# Patient Record
Sex: Female | Born: 1965 | Race: White | Hispanic: No | Marital: Married | Smoking: Never smoker
Health system: Southern US, Community
[De-identification: ages and names within clinical notes are randomized; demographics above are authoritative.]

## PROBLEM LIST (undated history)

## (undated) DIAGNOSIS — Z9223 Personal history of estrogen therapy: Secondary | ICD-10-CM

## (undated) DIAGNOSIS — K219 Gastro-esophageal reflux disease without esophagitis: Secondary | ICD-10-CM

## (undated) DIAGNOSIS — E7800 Pure hypercholesterolemia, unspecified: Secondary | ICD-10-CM

## (undated) DIAGNOSIS — J45909 Unspecified asthma, uncomplicated: Secondary | ICD-10-CM

## (undated) HISTORY — PX: ABDOMINAL HYSTERECTOMY: SHX81

## (undated) HISTORY — DX: Personal history of estrogen therapy: Z92.23

## (undated) HISTORY — PX: BILATERAL SALPINGOOPHORECTOMY: SHX1223

---

## 2000-09-28 ENCOUNTER — Ambulatory Visit (HOSPITAL_COMMUNITY): Admission: RE | Admit: 2000-09-28 | Discharge: 2000-09-28 | Payer: Self-pay | Admitting: Obstetrics and Gynecology

## 2000-09-28 ENCOUNTER — Encounter: Payer: Self-pay | Admitting: Obstetrics and Gynecology

## 2000-10-15 ENCOUNTER — Inpatient Hospital Stay (HOSPITAL_COMMUNITY): Admission: RE | Admit: 2000-10-15 | Discharge: 2000-10-17 | Payer: Self-pay | Admitting: Obstetrics and Gynecology

## 2000-11-12 ENCOUNTER — Ambulatory Visit (HOSPITAL_COMMUNITY): Admission: RE | Admit: 2000-11-12 | Discharge: 2000-11-12 | Payer: Self-pay | Admitting: *Deleted

## 2000-11-12 ENCOUNTER — Encounter: Payer: Self-pay | Admitting: Specialist

## 2001-07-22 ENCOUNTER — Encounter: Payer: Self-pay | Admitting: Family Medicine

## 2001-07-22 ENCOUNTER — Ambulatory Visit (HOSPITAL_COMMUNITY): Admission: RE | Admit: 2001-07-22 | Discharge: 2001-07-22 | Payer: Self-pay | Admitting: Family Medicine

## 2002-11-15 ENCOUNTER — Encounter: Payer: Self-pay | Admitting: Family Medicine

## 2002-11-15 ENCOUNTER — Ambulatory Visit (HOSPITAL_COMMUNITY): Admission: RE | Admit: 2002-11-15 | Discharge: 2002-11-15 | Payer: Self-pay | Admitting: Family Medicine

## 2003-06-23 ENCOUNTER — Inpatient Hospital Stay (HOSPITAL_COMMUNITY): Admission: AD | Admit: 2003-06-23 | Discharge: 2003-06-27 | Payer: Self-pay | Admitting: Family Medicine

## 2004-04-08 ENCOUNTER — Ambulatory Visit (HOSPITAL_COMMUNITY): Admission: RE | Admit: 2004-04-08 | Discharge: 2004-04-08 | Payer: Self-pay | Admitting: Obstetrics and Gynecology

## 2005-04-09 ENCOUNTER — Ambulatory Visit (HOSPITAL_COMMUNITY): Admission: RE | Admit: 2005-04-09 | Discharge: 2005-04-09 | Payer: Self-pay | Admitting: Obstetrics and Gynecology

## 2006-04-16 ENCOUNTER — Ambulatory Visit (HOSPITAL_COMMUNITY): Admission: RE | Admit: 2006-04-16 | Discharge: 2006-04-16 | Payer: Self-pay | Admitting: Obstetrics and Gynecology

## 2006-04-22 ENCOUNTER — Ambulatory Visit: Payer: Self-pay | Admitting: Cardiovascular Disease

## 2006-04-28 ENCOUNTER — Encounter: Payer: Self-pay | Admitting: Cardiovascular Disease

## 2006-04-28 ENCOUNTER — Encounter (INDEPENDENT_AMBULATORY_CARE_PROVIDER_SITE_OTHER): Payer: Self-pay | Admitting: Specialist

## 2006-04-28 ENCOUNTER — Encounter: Admission: RE | Admit: 2006-04-28 | Discharge: 2006-04-28 | Payer: Self-pay | Admitting: Obstetrics and Gynecology

## 2006-05-11 ENCOUNTER — Encounter (HOSPITAL_COMMUNITY): Admission: RE | Admit: 2006-05-11 | Discharge: 2006-06-10 | Payer: Self-pay | Admitting: Cardiovascular Disease

## 2006-05-11 ENCOUNTER — Ambulatory Visit: Payer: Self-pay | Admitting: Internal Medicine

## 2008-09-04 ENCOUNTER — Ambulatory Visit (HOSPITAL_COMMUNITY): Admission: RE | Admit: 2008-09-04 | Discharge: 2008-09-04 | Payer: Self-pay | Admitting: Obstetrics & Gynecology

## 2008-10-20 ENCOUNTER — Emergency Department (HOSPITAL_COMMUNITY): Admission: EM | Admit: 2008-10-20 | Discharge: 2008-10-20 | Payer: Self-pay | Admitting: Emergency Medicine

## 2009-10-17 ENCOUNTER — Ambulatory Visit (HOSPITAL_COMMUNITY): Admission: RE | Admit: 2009-10-17 | Discharge: 2009-10-17 | Payer: Self-pay | Admitting: Obstetrics & Gynecology

## 2010-07-07 ENCOUNTER — Encounter: Payer: Self-pay | Admitting: Obstetrics and Gynecology

## 2010-11-01 NOTE — Procedures (Signed)
   NAME:  Jane Baker, Jane Baker                         ACCOUNT NO.:  [id number]   MEDICAL RECORD NO.:  [id number]                   PATIENT TYPE:  OUT   LOCATION:  RESP                                 FACILITY:  [facility]   PHYSICIAN:  Edward L. Juanetta Gosling, M.D.             DATE OF BIRTH:  [birth year]   DATE OF PROCEDURE:  DATE OF DISCHARGE:                              PULMONARY FUNCTION TEST   Spirometry shows no evidence of ventilatory defect.  There is airflow  obstruction at the level of the small airways.  This is relatively mild.                                               Edward L. Juanetta Gosling, M.D.    Gwenlyn Found  D:  11/16/2002  T:  11/16/2002  Job:  [id number]   cc:   Donna Bernard, M.D.  [address]  [place]  [place] [address]  Fax: [phone]

## 2010-11-01 NOTE — Consult Note (Signed)
NAME:  Jane Baker, Jane Baker                         ACCOUNT NO.:  [id number]   MEDICAL RECORD NO.:  [id number]                   PATIENT TYPE:  INP   LOCATION:  A318                                 FACILITY:  [facility]   PHYSICIAN:  Lionel December, M.D.                 DATE OF BIRTH:  06-29-1965   DATE OF CONSULTATION:  06/24/2003  DATE OF DISCHARGE:                                   CONSULTATION   CONSULTING PHYSICIAN:  Lionel December, M.D.   REASON FOR CONSULTATION:  Persistent diarrhea in a patient felt to have  viral gastroenteritis.   HISTORY OF PRESENT ILLNESS:  [name] is a 45 year old Caucasian female who was  admitted to Dr. Lorin Picket A. Luking's service two days ago with copious non-  bloody diarrhea.  Her stool studies are negative, although a culture is  still not completed.  She has not felt any better with supportive therapy.  The patient's present illness began,on June 17, 2003, when she just did  not feel well.  The following day, she had multiple bowel movements.  She  also had a low-grade fever and felt nauseated.  Her diarrhea continued.  She  was seen by Dr. Lilyan Punt on June 20, 2003.  She was treated  symptomatically but her symptoms persisted.  She also began to feel weak and  lightheaded, therefore, was hospitalized.  In the hospital, she has had low-  grade temperature.  Her stool C. diff toxin titer has been negative.  She  had no fecal white cells.  O&P was also negative and cultures are at this  point are not growing any __________ pathogen.  The patient did throw up two  days ago, after she took a dose of Darvocet-N 100.  She has been nauseated.  She did eat a little bit of her sandwich this afternoon.  She already has  had seven stools today.  She feels extremely weak.  She also has been having  a headache.  She has not had rectal bleeding.  She has noted some soreness  on the left side of her abdomen.  She denies joint pain or skin rash.  She  also has not had  any sore throat.  About three week ago, she had a splinter  to one of her fingers and had cellulitis and was treated with amoxicillin  but did not have any trouble at that time.  She has not recently traveled  out of the country and did drink well water, but no other member of the  family has been sick.  The patient has lost about 5 pounds with this  illness.   MEDICATIONS:  She is on:  1. Estratest tab one daily.  2. She was on Flagyl IV which was discontinued this morning.  3. She is on dicyclomine 10 mg t.i.d. p.r.n.  4. Phenergan 25 mg IV q.4h. p.r.n.  5. Ambien 10 mg q.h.s.  p.r.n.  6. Darvocet-N 100 one q.4h. p.r.n.   PAST MEDICAL HISTORY:  1. She does not have any medical problems.  2. She had a hysterectomy three years ago.  3. Two years ago, she had bilateral oophorectomy with an incidental     appendectomy.  4. She was diagnosed with endometriosis.   ALLERGIES:  Possibly to SULFA, according to the patient's mother.   FAMILY HISTORY:  Noncontributory.  She has three sisters in good health.   SOCIAL HISTORY:  She is married.  She works part-time.  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  She does not smoke cigarettes or drink alcohol.   PHYSICAL EXAMINATION:  GENERAL:  A well-developed, well-nourished, Caucasian  female who appears to be acutely ill.  She prefers to keep her eyes closed;  however, she does not appear to be in any pain.  VITAL SIGNS:  Admission weight recorded to be 140.4 pounds.  She is 5 feet 3  inches tall.  Pulse 89 per minute, blood pressure 114/63, respiratory rate  is 18 and temp 99.5.  Her temp was 100.8 on the evening of June 22, 2003.  HEENT:  Conjunctivae is pink.  Sclera is nonicteric.  Oropharyngeal mucosa  is normal and moist.  NECK:  Supple.  No thyromegaly or lymphadenopathy.  CARDIAC:  With a regular rhythm,  normal S1 and S2.  No murmur or gallop  noted.  LUNGS:  Clear to auscultation.  ABDOMEN:  Symmetrical bowel sounds are  hyperactive.  Palpation reveals a  soft abdomen, mild tenderness at LLQ  and epigastric area.  No organomegaly  or masses.  RECTAL:  Deferred.  EXTREMITIES:  She does not have peripheral edema, skin rash or clubbing.   LABS:  On admission:  WBC 7.3, H&H 14.3 and 41.3, platelet count 228K.  She  had 79 segs, 9 lymhs, 11 monos and 1 eo.  Repeat CBC yesterday:  WBC was  5.6, she had 14% monos and 2% eosinophils.  Electrolytes, on admission,  sodium 134, potassium 3.5, chloride 102, CO2 25, glucose 86, and BUN 8,  creatinine 0.9.  LFTs normal except a protein of 5.8 and albumin 2.8.  Her  calcium is 7.8.  Amylase was 58, lipase 28.   Stool cultures, etcetera, as above.   ASSESSMENT:  [name] is a 45 year old Caucasian female with an eight day  history of the present illness.  She has copious non-bloody diarrhea.  Her  complete blood count is unremarkable except for a mild monocytosis.  Her  stool studies are negative, even though final on culture is still pending.  She has not improved much with supportive therapy and intravenous fluids.   I agree with Dr. Lilyan Punt that we are most likely dealing with  protracted viral illness.  She certainly could have Campylobacter infection  or toxic Escherichia coli infection.  I doubt that we are dealing with  inflammatory bowel disease presenting as an acute illness.  If the patient  does not improve within the next 48 hours, we will consider flexible  sigmoidoscopy.   RECOMMENDATIONS:  The IV fluids will be increased to 150 cc/hr for the next  24 hours.  We will give her MVI in IV fluids today and in the a.m.  Stool  studies will be repeated including culture for E. coli.  I have taken the  liberty of stopping her Darvocet and have requested morphine sulfate to be  used only on a p.r.n. basis.  I would also  encourage her to eat yogurt with each meal, and I have left her diet as it is except requested a low residue  diet.  CBC with differential  and metabolic-7 along with serum magnesium will  be repeated in the a.m.   I would like to thank Dr. Lilyan Punt for the opportunity to participate in  the care of this nice lady.      ___________________________________________                                            Lionel December, M.D.   NR/MEDQ  D:  06/24/2003  T:  06/24/2003  Job:  [id number]

## 2010-11-01 NOTE — H&P (Signed)
NAME:  Jane Baker, Jane Baker                         ACCOUNT NO.:  [id number]   MEDICAL RECORD NO.:  [id number]                   PATIENT TYPE:  OBV   LOCATION:  A318                                 FACILITY:  [facility]   PHYSICIAN:  Scott A. Gerda Diss, M.D.               DATE OF BIRTH:  [birth year]   DATE OF ADMISSION:  06/22/2003  DATE OF DISCHARGE:                                HISTORY & PHYSICAL   CHIEF COMPLAINT:  Nausea, persistent diarrhea and near syncope.   HISTORY OF PRESENT ILLNESS:  This 45 year old, white female presents with  significant weakness that has been brought on by persistent diarrhea over  the course of the past five days.  It started about six or seven days ago  with nausea and fatigue and then became persistent watery diarrhea.  There  was no mucus or blood.  The patient had gotten to the point where she would  not eat or drink anything because she felt it was stimulating diarrhea.  In  addition to this, she also relates moderate decrease in her urination and  feels very weak when she stands up.  She has come close to passing out and  husband has literally had to help her around.   PAST MEDICAL HISTORY:  Hysterectomy about three years ago.  No other  hospitalizations.   ALLERGIES:  No known drug allergies.   MEDICATIONS:  Estratest.   SOCIAL HISTORY:  She lives with husband.  She has not been on any camping  trips or unusual use of water.  She was treated in December with Augmentin  for a cellulitis.  She has not been on any other antibiotics.   FAMILY HISTORY:  Noncontributory.   REVIEW OF SYMPTOMS:  Negative for headaches, rash or joint pains.   PHYSICAL EXAMINATION:  GENERAL:  NAD.  Looks to feel ill.  HEENT:  Mucous membranes tacky.  NECK:  Supple.  CHEST:  CTA.  HEART:  Regular.  ABDOMEN:  Soft.  EXTREMITIES:  No edema.  VITAL SIGNS:  Blood pressure 100/60 initially and then when orthostatics  were done it was 110/70 lying, 100/60 sitting and 94/56  standing.  This was  at the office.   LABORATORY DATA AND X-RAY FINDINGS:  White count 7.3, hemoglobin 14.3, left  shift with 79 segs.  Potassium 3.5, sodium 134, BUN 8, creatinine 0.9.  Bilirubin 0.7.  Liver enzymes normal.  Albumin low at 2.8.  Amylase 58,  lipase 28.  Stool wbc's negative.   ASSESSMENT/PLAN:  Gastroenteritis with persistent diarrhea and weakness,  mild dehydration based on orthostasis.  Feel the patient would benefit from  intravenous fluids.  Will do some tests and look for Clostridium difficile  toxin.  It is hard to discern if the patient will need to stay beyond this  time frame at this moment.  Treat with Flagyl IV presumptively to cover for  Clostridium difficile.  ___________________________________________                                         Jonna Coup Gerda Diss, M.D.   Linus Orn  D:  06/23/2003  T:  06/23/2003  Job:  [id number]

## 2010-11-01 NOTE — Procedures (Signed)
[name], Jane Baker NO.:  [id number]   MEDICAL RECORD NO.:  [id number]          PATIENT TYPE:  OUT   LOCATION:  RAD                           FACILITY:  [facility]   PHYSICIAN:  Pricilla Riffle, MD, FACCDATE OF BIRTH:  1965-09-24   DATE OF PROCEDURE:  05/11/2006  DATE OF DISCHARGE:                                ECHOCARDIOGRAM   REFERRING PHYSICIAN:  Dr. Gerda Diss.   TEST INDICATIONS:  A 45 year old with a history of chest pain.  No prior  cardiac history.   A 2D echo with echo Doppler.  Left ventricle is normal in size with an  end-diastolic dimension of 44 mm, interventricular septum and posterior  wall are normal at 10 and 9 mm each.  The left atrium is normal.  Right  atrium and right ventricle are normal.   The aortic valve is grossly normal.  There is no insufficiency.  The  mitral valve is mildly thickened with trace insufficiency.  Pulmonic  valve is normal.  Tricuspid valve is normal with no insufficiency.   Overall, LVEF is normal with an LV at approximately 60%.  RVEF is  normal.  No pericardial effusion is seen.  IVC normal.      Pricilla Riffle, MD, [facility]  Electronically Signed     PVR/MEDQ  D:  05/11/2006  T:  05/11/2006  Job:  [id number]   cc:   Dr Gerda Diss

## 2010-11-01 NOTE — Assessment & Plan Note (Signed)
[facility]                         [place] CARDIOLOGY OFFICE NOTE   DREAMA, KUNA                      MRN:          [id number]  DATE:04/22/2006                            DOB:          28-Apr-1966    HISTORY OF PRESENT ILLNESS:  Mrs. Wille is a 45 year old patient referred  by Dr. Emelda Fear for increasing chest pain and shortness of breath.  She has  been having the problems for over the last 4-6 weeks.  Her pain is somewhat  atypical.  It can awaken her up at night.  However, she does get it with  exertion.  There is radiation to the left arm.  She has noted increasing  exertional dyspnea.  There is no pleurisy component.   There has been no sputum production or fever.  She is a nonsmoker.   Her coronary risk factors include positive family history and significant  hypercholesterolemia with an LDL cholesterol of about 170.  She was started  on Zocor.   She apparently just had a mammogram this week and was told that it was  abnormal, and she is to see one of the radiologists at [facility] tomorrow.   In regards to her other symptoms, she is not experiencing any palpitations,  lower extremity edema or syncope.   She had not previously had chest pain prior to 4-6 weeks ago.   She was referred by Dr. Emelda Fear and was concerned about her pain and  shortness of breath.   She has not had any functional tests or PFT's.   REVIEW OF SYSTEMS:  Remarkable for increasing fatigue, shortness of breath  and chest pain.   SOCIAL HISTORY:  She is happily married.  Her husband travels a lot during  the day as a Medical illustrator.  She has two older children who are doing well.  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   MEDICATIONS:  1. Estrogen for previous hysterectomy.  Because of her estrogen therapy,      she has had yearly mammograms.  2. Zocor 20 mg daily.   Symptoms predated this.   PHYSICAL EXAMINATION:  GENERAL:  She is a little bit  anxious.  VITAL SIGNS:  Blood pressure 120/70, pulse 70 and regular.  LUNGS:  Clear.  SKIN:  Warm and dry.  HEENT:  Normal.  No thyromegaly.  No carotid bruits.  No lymphadenopathy.  HEART:  There is an S1 and S2 with normal heart sounds.  ABDOMEN:  Benign.  LOWER EXTREMITIES:  Intact pulses.  No edema.   STUDIES:  EKG showed sinus rhythm.  No significant ST-T wave changes and  complete right bundle branch block.   IMPRESSION:  The patient's chest pain is somewhat debilitating and  increasing over the last 4-6 weeks.  It has atypical features.  However, she  is essentially post menopausal without a uterus and has significant  hyperlipidemia.   There are multiple ways we can proceed here.  We could do a stress Myoview  and a 2D echocardiogram in regards to her chest pain and dyspnea.   However, given her relatively low  heart rate, I think I would prefer to do a  cardiac CT on the patient.   This would allow [place] to rule out significant coronary artery disease and also  to assess her lung fields in the face of dyspnea.   We will try to get this approved with [facility].  The cardiac  CT would be to replace a stress test and an echocardiogram.   I honestly do not think if her cardiac CT was normal that she would need  further cardiac workup.  She will be going to [facility] in regards to her  abnormal mammogram.   The CT scan would also help [place] to visualize any possible other abnormalities  in regards to her abnormal mammogram.   Further recommendations will be based on her cardiac CT hopefully.  Again,  if [employer] will not cover this, we will have to order a stress Myoview  and an echocardiogram.  She will need nuclear imaging since she has  nonspecific ST-T wave changes and having complete right bundle on her ECG.    ______________________________  Noralyn Pick. Eden Emms, MD, [facility]    PCN/MedQ  DD: 04/22/2006  DT: 04/23/2006  Job #: [id number]

## 2010-11-01 NOTE — Discharge Summary (Signed)
NAME:  Jane Baker, Jane Baker                         ACCOUNT NO.:  [id number]   MEDICAL RECORD NO.:  [id number]                   PATIENT TYPE:  INP   LOCATION:  A318                                 FACILITY:  [facility]   PHYSICIAN:  Donna Bernard, M.D.             DATE OF BIRTH:  1966/02/03   DATE OF ADMISSION:  06/22/2003  DATE OF DISCHARGE:  06/27/2003                                 DISCHARGE SUMMARY   FINAL DIAGNOSES:  1. Gastroenteritis.  2. Severe abdominal pain secondary to #1.  3. Volume contraction.  4. Insomnia.   FINAL DISPOSITION:  Patient discharged to home.   DISCHARGE MEDICATIONS:  1. Imodium capsules up to t.i.d. p.r.n. for diarrhea.  2. Ambien 10 mg q.h.s. p.r.n. for sleep.   DISCHARGE INSTRUCTIONS:  1. Report any significant recurrence of severe abdominal pain.  2. Cultured yogurt t.i.d.   ADMISSION HISTORY AND PHYSICAL:  Please H&P as dictated.   HOSPITAL COURSE:  This patient is a 45 year old white female who was  admitted to the hospital with nausea, significant abdominal pain and  persistent diarrhea and near syncope.  The patient related feeling very weak  when she stood up and felt as if she was due to pass out.  The patient had  orthostatic changes at 110 or 70 lying down and blood pressure 90/56 while  standing up.  Blood work was done.  White blood count was normal, albumin  was borderline low.  Stool tests were done checking for Clostridium and  other pathological bacteria.   The patient was given IV fluids.  We elected to cover her with antibiotics  pending cultures.  48 hours after admission, the patient continued to have  copious nonbloody diarrhea.  Due to her ongoing severe distress, GI folks  were consulted.  They made some recommendations as far as adding Flagyl and  adjusting her fluids.  The patient had a slower than usual response to  therapy.  Each day, she had severe ongoing pain and a very anxious and  attentive family attending her.  In  addition, the diarrhea was disabling.  The GI folks thought that this was probably a viral infectious  enterocolitis.  However, they did recommend maintaining the same  medications.   On the day of discharge, the patient was finally feeling better.  Her  abdominal pain was improved.  Her stool tests remained negative.  The  patient was discharged home with diagnosis and disposition as noted above.    ___________________________________________                                         Donna Bernard, M.D.   WSL/MEDQ  D:  07/16/2003  T:  07/16/2003  Job:  [id number]

## 2012-09-23 ENCOUNTER — Telehealth: Payer: Self-pay | Admitting: Family Medicine

## 2012-09-23 NOTE — Telephone Encounter (Signed)
Yes please do

## 2012-09-23 NOTE — Telephone Encounter (Signed)
Med called into [facility]. Patient notified.

## 2012-09-23 NOTE — Telephone Encounter (Signed)
Patient says that her epi pen has expired and she is highly allergic to wasps. Can we call her another one in to [facility] in [place]?

## 2012-10-04 ENCOUNTER — Other Ambulatory Visit: Payer: Self-pay | Admitting: Adult Health

## 2012-10-04 DIAGNOSIS — Z139 Encounter for screening, unspecified: Secondary | ICD-10-CM

## 2012-10-07 ENCOUNTER — Ambulatory Visit (HOSPITAL_COMMUNITY)
Admission: RE | Admit: 2012-10-07 | Discharge: 2012-10-07 | Disposition: A | Discharge status: Home / Self Care | Payer: BC Managed Care – PPO | Source: Ambulatory Visit | Attending: Adult Health | Admitting: Adult Health

## 2012-10-07 DIAGNOSIS — Z1231 Encounter for screening mammogram for malignant neoplasm of breast: Secondary | ICD-10-CM | POA: Insufficient documentation

## 2012-10-07 DIAGNOSIS — Z139 Encounter for screening, unspecified: Secondary | ICD-10-CM

## 2012-10-08 ENCOUNTER — Telehealth: Payer: Self-pay | Admitting: Adult Health

## 2012-10-08 NOTE — Telephone Encounter (Signed)
Pt. Called wanted mammogram results from yesterday, was normal, get one next year

## 2012-12-02 ENCOUNTER — Telehealth: Payer: Self-pay | Admitting: Adult Health

## 2012-12-02 MED ORDER — ESTRADIOL 0.52 MG/0.87 GM (0.06%) TD GEL: 1.0000 "application " | Freq: Two times a day (BID) | TRANSDERMAL | Status: DC

## 2012-12-02 NOTE — Telephone Encounter (Signed)
Pt requesting Elestrin 0.06% samples. Pt states going out of town and has an appt already scheduled with Cyril Mourning, NP in a couple of weeks. Samples left at front desk for pt to pick up.

## 2012-12-30 ENCOUNTER — Encounter: Payer: Self-pay | Admitting: Adult Health

## 2012-12-30 ENCOUNTER — Other Ambulatory Visit: Payer: BC Managed Care – PPO

## 2012-12-30 ENCOUNTER — Ambulatory Visit (INDEPENDENT_AMBULATORY_CARE_PROVIDER_SITE_OTHER): Payer: BC Managed Care – PPO | Admitting: Adult Health

## 2012-12-30 VITALS — BP 120/80 | HR 70 | Ht 62.5 in | Wt 161.0 lb

## 2012-12-30 DIAGNOSIS — Z79899 Other long term (current) drug therapy: Secondary | ICD-10-CM

## 2012-12-30 DIAGNOSIS — Z79818 Long term (current) use of other agents affecting estrogen receptors and estrogen levels: Secondary | ICD-10-CM

## 2012-12-30 DIAGNOSIS — Z01419 Encounter for gynecological examination (general) (routine) without abnormal findings: Secondary | ICD-10-CM

## 2012-12-30 DIAGNOSIS — Z1212 Encounter for screening for malignant neoplasm of rectum: Secondary | ICD-10-CM

## 2012-12-30 DIAGNOSIS — Z9223 Personal history of estrogen therapy: Secondary | ICD-10-CM

## 2012-12-30 HISTORY — DX: Personal history of estrogen therapy: Z92.23

## 2012-12-30 LAB — COMPREHENSIVE METABOLIC PANEL
Albumin: 4.5 g/dL (ref 3.5–5.2)
BUN: 12 mg/dL (ref 6–23)
CO2: 27 mEq/L (ref 19–32)
Calcium: 10.3 mg/dL (ref 8.4–10.5)
Chloride: 102 mEq/L (ref 96–112)
Glucose, Bld: 89 mg/dL (ref 70–99)
Potassium: 5 mEq/L (ref 3.5–5.3)

## 2012-12-30 LAB — TSH: TSH: 1.755 u[IU]/mL (ref 0.350–4.500)

## 2012-12-30 LAB — HEMOCCULT GUIAC POC 1CARD (OFFICE)

## 2012-12-30 LAB — LIPID PANEL
Cholesterol: 289 mg/dL — ABNORMAL HIGH (ref 0–200)
LDL Cholesterol: 213 mg/dL — ABNORMAL HIGH (ref 0–99)
Triglycerides: 123 mg/dL (ref <150)

## 2012-12-30 LAB — CBC
HCT: 40.9 % (ref 36.0–46.0)
Hemoglobin: 14.1 g/dL (ref 12.0–15.0)
WBC: 7.3 10*3/uL (ref 4.0–10.5)

## 2012-12-30 MED ORDER — ESTRADIOL 0.52 MG/0.87 GM (0.06%) TD GEL: 1.0000 "application " | Freq: Two times a day (BID) | TRANSDERMAL | Status: DC

## 2012-12-30 NOTE — Patient Instructions (Addendum)
Physical in 1 year Mammogram yearly Colonoscopy at 48  

## 2012-12-30 NOTE — Progress Notes (Signed)
Patient ID: [name], female   DOB: 04/26/1966, 47 y.o.   MRN: [id number] History of Present Illness: Jane Baker is a 47 year old white female, married in for a physical. She likes Elestrin and can tell if she misses it.  Current Medications, Allergies, Past Medical History, Past Surgical History, Family History and Social History were reviewed in [employer] record.     Review of Systems: Patient denies any daily headaches, blurred vision, shortness of breath, chest pain, abdominal pain, problems with bowel movements, urination, or intercourse. But she had a headache last week in the back of her head and it lasted almost 2 days.No joint pain or mood changes, she does have some sleep disturbance.And increased stress lately as her son just got married.    Physical Exam:BP 120/80  Pulse 70  Ht 5' 2.5" (1.588 m)  Wt 161 lb (73.029 kg)  BMI 28.96 kg/m2 General:  Well developed, well nourished, no acute distress Skin:  Warm and dry,tan Neck:  Midline trachea, normal thyroid Lungs; Clear to auscultation bilaterally Breast:  No dominant palpable mass, retraction, or nipple discharge Cardiovascular: Regular rate and rhythm Abdomen:  Soft, non tender, no hepatosplenomegaly Pelvic:  External genitalia is normal in appearance.  The vagina is normal in appearance,the cervix and uterus are absent.  No adnexal masses or tenderness noted. Rectal: Good sphincter tone, no polyps felt, she does have a hemorrhoid. Hemoccult negative. Extremities:  No swelling or varicosities noted Psych:  Alert and cooperative, seems happy   Impression: Yearly gyn exam-no pap Estrogen theapy    Plan: Check CB,CMP,TSH and lipids Physical yearly Mammogram yearly Colonoscopy at 48 Continue Elestrin Number of samples 6 Lot number [facility]    Exp date 10/14

## 2013-01-04 ENCOUNTER — Telehealth: Payer: Self-pay | Admitting: Adult Health

## 2013-01-04 MED ORDER — ROSUVASTATIN CALCIUM 5 MG PO TABS: 5.0000 mg | ORAL_TABLET | Freq: Every day | ORAL | Status: DC

## 2013-01-04 NOTE — Telephone Encounter (Signed)
Left message to call me about labs  

## 2013-01-04 NOTE — Telephone Encounter (Signed)
Pt aware of labs and need for statin and recheck labs in 3 months

## 2013-01-05 ENCOUNTER — Other Ambulatory Visit: Payer: Self-pay | Admitting: Adult Health

## 2013-01-05 MED ORDER — SIMVASTATIN 20 MG PO TABS: 20.0000 mg | ORAL_TABLET | Freq: Every evening | ORAL | Status: DC

## 2013-01-31 ENCOUNTER — Telehealth: Payer: Self-pay | Admitting: Family Medicine

## 2013-01-31 ENCOUNTER — Encounter: Payer: Self-pay | Admitting: Family Medicine

## 2013-01-31 ENCOUNTER — Ambulatory Visit (INDEPENDENT_AMBULATORY_CARE_PROVIDER_SITE_OTHER): Payer: BC Managed Care – PPO | Admitting: Family Medicine

## 2013-01-31 VITALS — BP 112/78 | Temp 98.3°F | Ht 62.5 in | Wt 160.4 lb

## 2013-01-31 DIAGNOSIS — E785 Hyperlipidemia, unspecified: Secondary | ICD-10-CM | POA: Insufficient documentation

## 2013-01-31 DIAGNOSIS — J329 Chronic sinusitis, unspecified: Secondary | ICD-10-CM

## 2013-01-31 DIAGNOSIS — J45909 Unspecified asthma, uncomplicated: Secondary | ICD-10-CM

## 2013-01-31 DIAGNOSIS — J683 Other acute and subacute respiratory conditions due to chemicals, gases, fumes and vapors: Secondary | ICD-10-CM | POA: Insufficient documentation

## 2013-01-31 MED ORDER — AZITHROMYCIN 250 MG PO TABS: ORAL_TABLET | ORAL | Status: DC

## 2013-01-31 MED ORDER — ALBUTEROL SULFATE HFA 108 (90 BASE) MCG/ACT IN AERS: 2.0000 | INHALATION_SPRAY | Freq: Four times a day (QID) | RESPIRATORY_TRACT | Status: DC | PRN

## 2013-01-31 NOTE — Telephone Encounter (Signed)
Left message to return call 

## 2013-01-31 NOTE — Progress Notes (Signed)
  Subjective:    Patient ID: Jane Baker, female    DOB: 11-May-1966, 47 y.o.   MRN: [id number]  Sore Throat  This is a new problem. The current episode started in the past 7 days. The problem has been gradually worsening. Neither side of throat is experiencing more pain than the other. There has been no fever. The pain is at a severity of 5/10. The pain is moderate. Associated symptoms include congestion and ear pain. Pertinent negatives include no abdominal pain or headaches. She has had no exposure to strep. She has tried nothing (mucinex d and tylenol) for the symptoms.    Some ear discomfort intermit  Review of Systems  HENT: Positive for ear pain and congestion.   Gastrointestinal: Negative for abdominal pain.  Neurological: Negative for headaches.   Also notes wheezing intermittently, would like refill on inhaler. Uses rarely usually during sickness    Objective:   Physical Exam  Alert no acute distress. HEENT moderate nasal congestion. Maxillary tenderness pharynx slight erythema neck supple. Lungs clear heart regular in rhythm      Assessment & Plan:  Impression 1 acute rhinosinusitis #2 reactive airways plan Z-Pak. Ventolin when necessary. Symptomatic care discussed. WSL

## 2013-01-31 NOTE — Telephone Encounter (Signed)
Patient will need office visit for antibiotic.

## 2013-01-31 NOTE — Telephone Encounter (Signed)
Advised that patient needed office visit before we can prescribe antibiotics. Office visit scheduled.

## 2013-01-31 NOTE — Telephone Encounter (Signed)
Patient has a cough, sore throat, no fever, draingage in throat, congestion and spouse says that she absolutely does not want to come in because she is so sick and is wanting to know if we can call something in.

## 2013-02-21 ENCOUNTER — Telehealth: Payer: Self-pay | Admitting: Family Medicine

## 2013-02-21 NOTE — Telephone Encounter (Signed)
Last seen three wks ago for cough and wheezing, rec f u ov

## 2013-02-21 NOTE — Telephone Encounter (Signed)
Pt still having right side pain, especially when she coughs or sneezes. Wants to know if she needs more meds or xray? Please advise, [phone]

## 2013-02-21 NOTE — Telephone Encounter (Signed)
Patient was transferred to front desk to schedule appointment.  

## 2013-02-23 ENCOUNTER — Ambulatory Visit (INDEPENDENT_AMBULATORY_CARE_PROVIDER_SITE_OTHER): Payer: BC Managed Care – PPO | Admitting: Family Medicine

## 2013-02-23 ENCOUNTER — Encounter: Payer: Self-pay | Admitting: Family Medicine

## 2013-02-23 VITALS — BP 138/92 | Temp 98.4°F | Ht 62.5 in | Wt 163.6 lb

## 2013-02-23 DIAGNOSIS — R079 Chest pain, unspecified: Secondary | ICD-10-CM

## 2013-02-23 MED ORDER — ETODOLAC ER 400 MG PO TB24: 400.0000 mg | ORAL_TABLET | Freq: Every day | ORAL | Status: DC

## 2013-02-23 NOTE — Progress Notes (Signed)
  Subjective:    Patient ID: Jane Baker, female    DOB: 1966/03/14, 47 y.o.   MRN: [id number]  Abdominal Pain This is a new problem. The current episode started 1 to 4 weeks ago. The problem occurs intermittently. The problem has been gradually worsening. The pain is located in the RUQ and right flank. The quality of the pain is sharp. The abdominal pain radiates to the chest, back, RUQ and right flank. The pain is aggravated by coughing and palpation. She has tried acetaminophen (Heating pad) for the symptoms. The treatment provided mild relief.    Cong much better bad cough, sneeze hard. Motion causes worsening. Pain with movement   ruq pos tenderness,      Review of Systems  Gastrointestinal: Positive for abdominal pain.   Pain has been sharp very severe right-sided. Worse with motion. Worse with direct pressure on that region. Worse with a cough. Patient worried about pneumonia or pleurisy.    Objective:   Physical Exam  Alert no acute distress. Lungs clear. Heart regular rate and rhythm. Right costal margin exquisitely tender to palpation. Abdomen benign.      Assessment & Plan:  Impression post cough costochondritis discussed at length. Plan hold off on x-ray will not be helpful discussed. Lodine twice a day with food when necessary to affected area. Use albuterol the next week to diminished lingering cough. WSL

## 2013-04-21 ENCOUNTER — Other Ambulatory Visit: Payer: Self-pay

## 2013-05-19 ENCOUNTER — Other Ambulatory Visit: Payer: BC Managed Care – PPO

## 2013-05-19 DIAGNOSIS — E785 Hyperlipidemia, unspecified: Secondary | ICD-10-CM

## 2013-05-19 LAB — CBC
MCV: 85.3 fL (ref 78.0–100.0)
Platelets: 279 10*3/uL (ref 150–400)
RDW: 13.2 % (ref 11.5–15.5)
WBC: 5.4 10*3/uL (ref 4.0–10.5)

## 2013-05-19 LAB — COMPREHENSIVE METABOLIC PANEL
ALT: 31 U/L (ref 0–35)
AST: 28 U/L (ref 0–37)
Calcium: 9.8 mg/dL (ref 8.4–10.5)
Chloride: 102 mEq/L (ref 96–112)
Creat: 0.75 mg/dL (ref 0.50–1.10)

## 2013-05-19 LAB — LIPID PANEL
HDL: 55 mg/dL (ref >39)
LDL Cholesterol: 119 mg/dL — ABNORMAL HIGH (ref 0–99)
Total CHOL/HDL Ratio: 3.5 Ratio
VLDL: 19 mg/dL (ref 0–40)

## 2013-05-20 ENCOUNTER — Telehealth: Payer: Self-pay | Admitting: Adult Health

## 2013-05-20 NOTE — Telephone Encounter (Signed)
Left message labs good. 

## 2013-07-21 ENCOUNTER — Telehealth: Payer: Self-pay

## 2013-07-21 NOTE — Telephone Encounter (Signed)
Pt aware to come by and pick up samples 

## 2013-10-26 ENCOUNTER — Encounter: Payer: Self-pay | Admitting: *Deleted

## 2013-10-26 MED ORDER — ESTRADIOL 0.52 MG/0.87 GM (0.06%) TD GEL: 1.0000 "application " | Freq: Two times a day (BID) | TRANSDERMAL | Status: DC

## 2013-10-26 NOTE — Progress Notes (Signed)
Patient ID: Jane Baker, female   DOB: [birth year], 48 y.o.   MRN: [id number] Pt came by office and picked up samples of Elestrin gel, GEES 09/2014.

## 2013-11-25 ENCOUNTER — Other Ambulatory Visit: Payer: Self-pay | Admitting: Adult Health

## 2013-11-25 DIAGNOSIS — Z1231 Encounter for screening mammogram for malignant neoplasm of breast: Secondary | ICD-10-CM

## 2013-12-01 ENCOUNTER — Ambulatory Visit (HOSPITAL_COMMUNITY)
Admission: RE | Admit: 2013-12-01 | Discharge: 2013-12-01 | Disposition: A | Discharge status: Home / Self Care | Payer: BC Managed Care – PPO | Source: Ambulatory Visit | Attending: Adult Health | Admitting: Adult Health

## 2013-12-01 DIAGNOSIS — Z1231 Encounter for screening mammogram for malignant neoplasm of breast: Secondary | ICD-10-CM | POA: Insufficient documentation

## 2014-01-09 ENCOUNTER — Telehealth: Payer: Self-pay | Admitting: Adult Health

## 2014-01-09 NOTE — Telephone Encounter (Signed)
Ok to get elestrin samples

## 2014-01-11 ENCOUNTER — Other Ambulatory Visit: Payer: Self-pay | Admitting: Adult Health

## 2014-01-11 MED ORDER — ESTRADIOL 0.52 MG/0.87 GM (0.06%) TD GEL
1.0000 | Freq: Every day | TRANSDERMAL | Status: DC
Start: 2014-01-11 — End: 2014-03-28

## 2014-03-28 ENCOUNTER — Telehealth: Payer: Self-pay | Admitting: *Deleted

## 2014-03-28 MED ORDER — ESTRADIOL 0.52 MG/0.87 GM (0.06%) TD GEL: 1.0000 "application " | Freq: Every day | TRANSDERMAL | Status: DC

## 2014-03-28 NOTE — Telephone Encounter (Signed)
Left message elestrin called in

## 2014-04-17 ENCOUNTER — Encounter: Payer: Self-pay | Admitting: Family Medicine

## 2014-07-19 ENCOUNTER — Encounter: Payer: Self-pay | Admitting: Family Medicine

## 2014-08-11 ENCOUNTER — Telehealth: Payer: Self-pay | Admitting: Family Medicine

## 2014-08-11 NOTE — Telephone Encounter (Signed)
Pt called requesting referral to allergy specialist, pt has [place] (which requires a referral auth)  We have not seen pt here since 02/23/13, no allergies are listed in chart, pt states she normally sees [place]   Pt was seen at [facility] ER 10/20/08 for wheezing following a bee sting, was given Epi-Pen, steroids  Explained to pt that the provider may want to see her before processing referral, please advise

## 2014-08-12 NOTE — Telephone Encounter (Signed)
Not under active rsx from [place], needs ov first -insur co s and specialists now requiring a lot more info needs ov to assess and document

## 2014-08-14 NOTE — Telephone Encounter (Signed)
Pt was transferred up front to schedule and OV with Hoyle Sauer for allergy referral related to bee stings.

## 2014-08-18 ENCOUNTER — Encounter: Payer: Self-pay | Admitting: Nurse Practitioner

## 2014-08-18 ENCOUNTER — Ambulatory Visit (INDEPENDENT_AMBULATORY_CARE_PROVIDER_SITE_OTHER): Payer: 59 | Admitting: Nurse Practitioner

## 2014-08-18 VITALS — BP 128/90 | Ht 62.75 in | Wt 167.0 lb

## 2014-08-18 DIAGNOSIS — T63441A Toxic effect of venom of bees, accidental (unintentional), initial encounter: Secondary | ICD-10-CM | POA: Diagnosis not present

## 2014-08-18 NOTE — Patient Instructions (Signed)
Soy Flaxseed Black cohosh

## 2014-08-21 ENCOUNTER — Encounter: Payer: Self-pay | Admitting: Nurse Practitioner

## 2014-08-21 NOTE — Progress Notes (Signed)
Subjective:  Presents requesting referral to allergy specialist for evaluation after having severe reaction to a bee sting. Had trouble breathing with hives all over. No wheezing. Able to swallow. Was seen at ED and given Epi Pen.  Objective:   BP 128/90 mmHg  Ht 5' 2.75" (1.594 m)  Wt 167 lb (75.751 kg)  BMI 29.81 kg/m2 NAD. Alert, oriented. Lungs clear. Heart RRR.  Assessment: Allergic reaction to bee sting, accidental or unintentional, initial encounter - Plan: Ambulatory referral to Allergy  Plan: patient has Benadryl also with Epi Pen. To use Epi Pen with severe reaction that affects airway or breathing. To see help immediately if used by calling EMS or getting to ED. Also take benadryl with bee sting. Remove stinger if still in place and apply ice to area.  Will refer to allergy specialist per her request. Call back sooner if needed.

## 2014-09-01 ENCOUNTER — Encounter: Payer: Self-pay | Admitting: Family Medicine

## 2014-10-31 ENCOUNTER — Other Ambulatory Visit (HOSPITAL_COMMUNITY): Payer: Self-pay | Admitting: Allergy and Immunology

## 2014-10-31 ENCOUNTER — Ambulatory Visit (HOSPITAL_COMMUNITY)
Admission: RE | Admit: 2014-10-31 | Discharge: 2014-10-31 | Disposition: A | Discharge status: Home / Self Care | Payer: 59 | Source: Ambulatory Visit | Attending: Allergy and Immunology | Admitting: Allergy and Immunology

## 2014-10-31 DIAGNOSIS — R05 Cough: Secondary | ICD-10-CM | POA: Insufficient documentation

## 2014-10-31 DIAGNOSIS — R059 Cough, unspecified: Secondary | ICD-10-CM

## 2015-02-06 ENCOUNTER — Other Ambulatory Visit: Payer: Self-pay | Admitting: Adult Health

## 2015-02-06 DIAGNOSIS — Z1231 Encounter for screening mammogram for malignant neoplasm of breast: Secondary | ICD-10-CM

## 2015-02-12 ENCOUNTER — Ambulatory Visit (HOSPITAL_COMMUNITY)
Admission: RE | Admit: 2015-02-12 | Discharge: 2015-02-12 | Disposition: A | Discharge status: Home / Self Care | Payer: 59 | Source: Ambulatory Visit | Attending: Adult Health | Admitting: Adult Health

## 2015-02-12 DIAGNOSIS — Z1231 Encounter for screening mammogram for malignant neoplasm of breast: Secondary | ICD-10-CM

## 2015-04-03 ENCOUNTER — Telehealth: Payer: Self-pay | Admitting: Family Medicine

## 2015-04-03 DIAGNOSIS — Z1211 Encounter for screening for malignant neoplasm of colon: Secondary | ICD-10-CM

## 2015-04-03 NOTE — Telephone Encounter (Signed)
Ridiculous lets do

## 2015-04-03 NOTE — Telephone Encounter (Signed)
Called patient and informed her per Dr.Steve Luking-Referral was put in for Gastroenterology so patient may get her screening colonoscopy. Patient verbalized understanding.

## 2015-04-03 NOTE — Telephone Encounter (Signed)
Pt called requesting referral for screening colonoscopy.  States she's almost 49 years old and has not had one yet.  Please initiate in system so that I may process (pt's [facility] [employer] insurance requires referral)

## 2015-04-18 ENCOUNTER — Encounter: Payer: Self-pay | Admitting: Family Medicine

## 2015-06-12 ENCOUNTER — Ambulatory Visit (INDEPENDENT_AMBULATORY_CARE_PROVIDER_SITE_OTHER): Payer: 59 | Admitting: Family Medicine

## 2015-06-12 ENCOUNTER — Encounter: Payer: Self-pay | Admitting: Family Medicine

## 2015-06-12 VITALS — BP 102/72 | Temp 98.9°F | Ht 62.75 in | Wt 166.0 lb

## 2015-06-12 DIAGNOSIS — J329 Chronic sinusitis, unspecified: Secondary | ICD-10-CM | POA: Diagnosis not present

## 2015-06-12 DIAGNOSIS — R0789 Other chest pain: Secondary | ICD-10-CM

## 2015-06-12 DIAGNOSIS — J683 Other acute and subacute respiratory conditions due to chemicals, gases, fumes and vapors: Secondary | ICD-10-CM

## 2015-06-12 DIAGNOSIS — J4520 Mild intermittent asthma, uncomplicated: Secondary | ICD-10-CM | POA: Diagnosis not present

## 2015-06-12 DIAGNOSIS — J310 Chronic rhinitis: Secondary | ICD-10-CM

## 2015-06-12 MED ORDER — ALBUTEROL SULFATE HFA 108 (90 BASE) MCG/ACT IN AERS: 2.0000 | INHALATION_SPRAY | Freq: Four times a day (QID) | RESPIRATORY_TRACT | Status: DC | PRN

## 2015-06-12 MED ORDER — HYDROCODONE-HOMATROPINE 5-1.5 MG/5ML PO SYRP: 5.0000 mL | ORAL_SOLUTION | Freq: Every evening | ORAL | Status: DC | PRN

## 2015-06-12 MED ORDER — CEFPROZIL 500 MG PO TABS: 500.0000 mg | ORAL_TABLET | Freq: Two times a day (BID) | ORAL | Status: DC

## 2015-06-12 NOTE — Progress Notes (Signed)
   Subjective:    Patient ID: Jane Baker, female    DOB: 11/26/1965, 49 y.o.   MRN: [id number]  Sinusitis This is a new problem. The current episode started 1 to 4 weeks ago. The problem is unchanged. The pain is moderate. Associated symptoms include congestion, coughing, ear pain, headaches and a sore throat. Past treatments include oral decongestants. The treatment provided no relief.   Patient has no other concerns at this time.   Fever stopped up and bad cough  Pain in back and ribs  Inhaler   Cong and prod cough   Non prod cough, doesnot smoke  mucinex and nyquil   Plain muc during the day    Review of Systems  HENT: Positive for congestion, ear pain and sore throat.   Respiratory: Positive for cough.   Neurological: Positive for headaches.       Objective:   Physical Exam  Alert moderate malaise nasal congestion frontal tenderness voice hoarse neck supple lungs bronchial cough heart regular rate and rhythm      Assessment & Plan:  Impression post viral rhinosinusitis/bronchitis with chest wall pain and likely element of reactive airways plan albuterol 2 sprays 4 times a day. Antibiotics prescribed. Cough medicine discussed symptom care discussed WSL

## 2015-08-01 ENCOUNTER — Telehealth: Payer: Self-pay | Admitting: Family Medicine

## 2015-08-01 MED ORDER — EPINEPHRINE 0.3 MG/0.3ML IJ SOAJ: 0.3000 mg | Freq: Once | INTRAMUSCULAR | Status: DC

## 2015-08-01 NOTE — Telephone Encounter (Signed)
Patient states she has epi pen for bee allergy and just noticed the one she carries expired in 2015 and would like a new one sent to the pharmacy. Rx sent electronically to pharmacy.

## 2015-08-01 NOTE — Telephone Encounter (Signed)
Not on med list

## 2015-08-01 NOTE — Telephone Encounter (Signed)
i see bee venom allergy listed, cal pt , see if rx'ed int the past, document and refill, generic form

## 2015-08-01 NOTE — Telephone Encounter (Signed)
Pt is needing a new prescription sent over for her epi-pen.     [facility]

## 2015-08-14 ENCOUNTER — Telehealth: Payer: Self-pay | Admitting: Family Medicine

## 2015-08-14 DIAGNOSIS — R5383 Other fatigue: Secondary | ICD-10-CM

## 2015-08-14 DIAGNOSIS — E785 Hyperlipidemia, unspecified: Secondary | ICD-10-CM

## 2015-08-14 DIAGNOSIS — Z79899 Other long term (current) drug therapy: Secondary | ICD-10-CM

## 2015-08-14 NOTE — Telephone Encounter (Signed)
Patient has schedule wellness on 3/15 and needing labs done.She would like to have her thyroid check also.

## 2015-08-15 ENCOUNTER — Other Ambulatory Visit: Payer: Self-pay | Admitting: Nurse Practitioner

## 2015-08-15 DIAGNOSIS — E785 Hyperlipidemia, unspecified: Secondary | ICD-10-CM

## 2015-08-15 NOTE — Telephone Encounter (Signed)
Please order Met 7, lipid profile, liver profile, TSH and vit D level. She does have a diagnosis of hyperlipidemia on her problem list.

## 2015-08-15 NOTE — Telephone Encounter (Signed)
Orders ready. Pt notified.  

## 2015-08-21 LAB — LIPID PANEL
CHOLESTEROL TOTAL: 247 mg/dL — AB (ref 100–199)
Chol/HDL Ratio: 4.9 ratio units — ABNORMAL HIGH (ref 0.0–4.4)
HDL: 50 mg/dL (ref >39)
LDL CALC: 166 mg/dL — AB (ref 0–99)
TRIGLYCERIDES: 156 mg/dL — AB (ref 0–149)
VLDL Cholesterol Cal: 31 mg/dL (ref 5–40)

## 2015-08-21 LAB — BASIC METABOLIC PANEL
BUN/Creatinine Ratio: 15 (ref 9–23)
BUN: 12 mg/dL (ref 6–24)
CALCIUM: 9.9 mg/dL (ref 8.7–10.2)
CO2: 23 mmol/L (ref 18–29)
CREATININE: 0.78 mg/dL (ref 0.57–1.00)
Chloride: 101 mmol/L (ref 96–106)
GFR calc Af Amer: 101 mL/min/{1.73_m2} (ref >59)
GFR, EST NON AFRICAN AMERICAN: 88 mL/min/{1.73_m2} (ref >59)
Glucose: 95 mg/dL (ref 65–99)
Potassium: 4.6 mmol/L (ref 3.5–5.2)
Sodium: 140 mmol/L (ref 134–144)

## 2015-08-21 LAB — HEPATIC FUNCTION PANEL
ALBUMIN: 4.4 g/dL (ref 3.5–5.5)
ALT: 21 IU/L (ref 0–32)
AST: 22 IU/L (ref 0–40)
Alkaline Phosphatase: 101 IU/L (ref 39–117)
Bilirubin Total: 0.3 mg/dL (ref 0.0–1.2)
Bilirubin, Direct: 0.09 mg/dL (ref 0.00–0.40)
TOTAL PROTEIN: 7.2 g/dL (ref 6.0–8.5)

## 2015-08-21 LAB — TSH: TSH: 2.37 u[IU]/mL (ref 0.450–4.500)

## 2015-08-21 LAB — VITAMIN D 25 HYDROXY (VIT D DEFICIENCY, FRACTURES): VIT D 25 HYDROXY: 31.5 ng/mL (ref 30.0–100.0)

## 2015-08-29 ENCOUNTER — Ambulatory Visit (INDEPENDENT_AMBULATORY_CARE_PROVIDER_SITE_OTHER): Payer: BLUE CROSS/BLUE SHIELD | Admitting: Nurse Practitioner

## 2015-08-29 ENCOUNTER — Encounter: Payer: Self-pay | Admitting: Nurse Practitioner

## 2015-08-29 VITALS — BP 120/80 | Ht 63.0 in | Wt 163.0 lb

## 2015-08-29 DIAGNOSIS — J683 Other acute and subacute respiratory conditions due to chemicals, gases, fumes and vapors: Secondary | ICD-10-CM

## 2015-08-29 DIAGNOSIS — J4520 Mild intermittent asthma, uncomplicated: Secondary | ICD-10-CM

## 2015-08-29 DIAGNOSIS — E785 Hyperlipidemia, unspecified: Secondary | ICD-10-CM | POA: Diagnosis not present

## 2015-08-29 DIAGNOSIS — Z0000 Encounter for general adult medical examination without abnormal findings: Secondary | ICD-10-CM

## 2015-08-29 MED ORDER — SIMVASTATIN 20 MG PO TABS: 20.0000 mg | ORAL_TABLET | Freq: Every evening | ORAL | Status: DC

## 2015-08-29 MED ORDER — FLUTICASONE PROPIONATE HFA 110 MCG/ACT IN AERO: 2.0000 | INHALATION_SPRAY | Freq: Two times a day (BID) | RESPIRATORY_TRACT | Status: DC

## 2015-08-31 ENCOUNTER — Encounter: Payer: Self-pay | Admitting: Nurse Practitioner

## 2015-08-31 NOTE — Progress Notes (Signed)
Subjective:    Patient ID: Jane Baker, female    DOB: March 15, 1966, 50 y.o.   MRN: [id number]  HPI presents for her wellness exam. Has had hysterectomy and BSO. Married, same sexual partner. Healthy diet. Active. Needs vision exam. Regular dental care. Using her albuterol inhaler more lately; about twice a day. Had her colonoscopy in November. Has been off Simvastatin.     Review of Systems  Constitutional: Negative for fever, activity change, appetite change and fatigue.  HENT: Negative for dental problem, ear pain, sinus pressure and sore throat.   Eyes: Negative for visual disturbance.  Respiratory: Positive for cough, shortness of breath and wheezing. Negative for chest tightness.   Cardiovascular: Negative for chest pain.  Gastrointestinal: Negative for nausea, vomiting, abdominal pain, diarrhea, constipation and abdominal distention.  Genitourinary: Negative for dysuria, urgency, frequency, vaginal discharge, enuresis, difficulty urinating, genital sores and pelvic pain.       Objective:   Physical Exam  Constitutional: She is oriented to person, place, and time. She appears well-developed. No distress.  HENT:  Right Ear: External ear normal.  Left Ear: External ear normal.  Mouth/Throat: Oropharynx is clear and moist.  Neck: Normal range of motion. Neck supple. No tracheal deviation present. No thyromegaly present.  Cardiovascular: Normal rate, regular rhythm and normal heart sounds.  Exam reveals no gallop.   No murmur heard. Pulmonary/Chest: Effort normal and breath sounds normal.  Abdominal: Soft. She exhibits no distension. There is no tenderness.  Genitourinary: Vagina normal. No vaginal discharge found.  External GU: no rashes or lesions. Vagina: slightly pink tissue; no discharge. Bimanual exam: normal. No rectal exam or hemoccult cards; had colonoscopy in November.   Musculoskeletal: She exhibits no edema.  Lymphadenopathy:    She has no cervical adenopathy.    Neurological: She is alert and oriented to person, place, and time.  Skin: Skin is warm and dry. No rash noted.  Psychiatric: She has a normal mood and affect. Her behavior is normal.  Vitals reviewed. Breast exam: areas of dense tissue; no masses; axillae no adenopathy.  Reviewed labs with patient from 3/6.       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 fluticasone (FLOVENT HFA) 110 MCG/ACT inhaler    Sig: Inhale 2 puffs into the lungs 2 (two) times daily. To prevent wheezing    Dispense:  1 Inhaler    Refill:  12    Order Specific Question:  Supervising Provider    Answer:  Mikey Kirschner [[id number]]  . simvastatin (ZOCOR) 20 MG tablet    Sig: Take 1 tablet (20 mg total) by mouth every evening.    Dispense:  90 tablet    Refill:  1    Order Specific Question:  Supervising Provider    Answer:  Mikey Kirschner [[id number]]   Trial of flovent. Call back in 2-3 weeks if no improvement. Understands albuterol is her rescue inhaler. Goal is use no more than 2-3 times per  week.  Recommend routine eye exam. Restart Simvastatin.  Return in about 1 year (around 08/28/2016) for physical.

## 2015-12-05 ENCOUNTER — Encounter: Payer: Self-pay | Admitting: Family Medicine

## 2016-03-24 ENCOUNTER — Other Ambulatory Visit: Payer: Self-pay | Admitting: Family Medicine

## 2016-03-24 DIAGNOSIS — Z1231 Encounter for screening mammogram for malignant neoplasm of breast: Secondary | ICD-10-CM

## 2016-03-26 ENCOUNTER — Ambulatory Visit (HOSPITAL_COMMUNITY)
Admission: RE | Admit: 2016-03-26 | Discharge: 2016-03-26 | Disposition: A | Discharge status: Home / Self Care | Payer: BLUE CROSS/BLUE SHIELD | Source: Ambulatory Visit | Attending: Family Medicine | Admitting: Family Medicine

## 2016-03-26 DIAGNOSIS — Z1231 Encounter for screening mammogram for malignant neoplasm of breast: Secondary | ICD-10-CM | POA: Diagnosis not present

## 2016-04-17 ENCOUNTER — Encounter: Payer: Self-pay | Admitting: Family Medicine

## 2016-04-17 ENCOUNTER — Ambulatory Visit (INDEPENDENT_AMBULATORY_CARE_PROVIDER_SITE_OTHER): Payer: BLUE CROSS/BLUE SHIELD | Admitting: Family Medicine

## 2016-04-17 VITALS — BP 110/72 | Ht 63.0 in | Wt 167.0 lb

## 2016-04-17 DIAGNOSIS — Z23 Encounter for immunization: Secondary | ICD-10-CM

## 2016-04-17 DIAGNOSIS — M19042 Primary osteoarthritis, left hand: Secondary | ICD-10-CM | POA: Diagnosis not present

## 2016-04-17 DIAGNOSIS — M19041 Primary osteoarthritis, right hand: Secondary | ICD-10-CM | POA: Diagnosis not present

## 2016-04-17 DIAGNOSIS — G5603 Carpal tunnel syndrome, bilateral upper limbs: Secondary | ICD-10-CM

## 2016-04-17 NOTE — Patient Instructions (Signed)
Night time velcro splints short wrist   Carpal Tunnel Syndrome Carpal tunnel syndrome is a condition that causes pain in your hand and arm. The carpal tunnel is a narrow area located on the palm side of your wrist. Repeated wrist motion or certain diseases may cause swelling within the tunnel. This swelling pinches the main nerve in the wrist (median nerve). CAUSES  This condition may be caused by:   Repeated wrist motions.  Wrist injuries.  Arthritis.  A cyst or tumor in the carpal tunnel.  Fluid buildup during pregnancy. Sometimes the cause of this condition is not known.  RISK FACTORS This condition is more likely to develop in:   People who have jobs that cause them to repeatedly move their wrists in the same motion, such as butchers and cashiers.  Women.  People with certain conditions, such as:  Diabetes.  Obesity.  An underactive thyroid (hypothyroidism).  Kidney failure. SYMPTOMS  Symptoms of this condition include:   A tingling feeling in your fingers, especially in your thumb, index, and middle fingers.  Tingling or numbness in your hand.  An aching feeling in your entire arm, especially when your wrist and elbow are bent for long periods of time.  Wrist pain that goes up your arm to your shoulder.  Pain that goes down into your palm or fingers.  A weak feeling in your hands. You may have trouble grabbing and holding items. Your symptoms may feel worse during the night.  DIAGNOSIS  This condition is diagnosed with a medical history and physical exam. You may also have tests, including:   An electromyogram (EMG). This test measures electrical signals sent by your nerves into the muscles.  X-rays. TREATMENT  Treatment for this condition includes:  Lifestyle changes. It is important to stop doing or modify the activity that caused your condition.  Physical or occupational therapy.  Medicines for pain and inflammation. This may include medicine that  is injected into your wrist.  A wrist splint.  Surgery. HOME CARE INSTRUCTIONS  If You Have a Splint:  Wear it as told by your health care provider. Remove it only as told by your health care provider.  Loosen the splint if your fingers become numb and tingle, or if they turn cold and blue.  Keep the splint clean and dry. General Instructions  Take over-the-counter and prescription medicines only as told by your health care provider.  Rest your wrist from any activity that may be causing your pain. If your condition is work related, talk to your employer about changes that can be made, such as getting a wrist pad to use while typing.  If directed, apply ice to the painful area:  Put ice in a plastic bag.  Place a towel between your skin and the bag.  Leave the ice on for 20 minutes, 2-3 times per day.  Keep all follow-up visits as told by your health care provider. This is important.  Do any exercises as told by your health care provider, physical therapist, or occupational therapist. [place] IF:   You have new symptoms.  Your pain is not controlled with medicines.  Your symptoms get worse.   This information is not intended to replace advice given to you by your health care provider. Make sure you discuss any questions you have with your health care provider.   Document Released: 05/30/2000 Document Revised: 02/21/2015 Document Reviewed: 10/18/2014 Elsevier Interactive Patient Education [employer].

## 2016-04-17 NOTE — Progress Notes (Signed)
   Subjective:    Patient ID: Jane Baker, female    DOB: 09/11/1965, 50 y.o.   MRN: [id number]  Hand Pain   The incident occurred more than 1 week ago. There was no injury mechanism. The pain is present in the right hand and left hand. The quality of the pain is described as aching. The pain does not radiate. The pain is moderate. The symptoms are aggravated by movement. Treatments tried: naproxen. The treatment provided mild relief.   Patient has no other concerns at this time.   Pain in thumb for about six months, holds pen differently. Pt right handed, working on crafts a lot  Pt saw chiropractor a d had an adjustment which hel[ped  Pt tried naproxen two qhs to hel sleep   Uses advil during  Laid off the crafts for about a week  Swelling and pain  On further history, patient really is presenting with 3 distinct pain syndromes her hand.  Patient notes stiffness in her hands. Over time she's had small bumps appear in her knuckles. There is family history of arthritis. Both osteoarthritis and rheumatoid arthritis she is concerned about this understandably.  Patient is also had right thumb pain with movement over the past 6 months. She's been involved in a lot of arts and crafts using a [employer].  Patient's waking up at nighttime with numbness and tingling both hands right greater than left has to shake it out. No noticeable weakness.     Review of Systems No headache, no major weight loss or weight gain, no chest pain no back pain abdominal pain no change in bowel habits complete ROS otherwise negative     Objective:   Physical Exam  Alert vitals stable, NAD. Blood pressure good on repeat. HEENT normal. Lungs clear. Heart regular rate and rhythm. Hands bilateral Heberden's nodes evident. Primarily in the medial and distal interphalangeal joints. No synovial thickening. Positive Phalen's sign. Plus minus Tinel sign. Grip intact strength intact reflexes intact        Assessment & Plan:  Impression long discussion held about the following 3 problems #1 osteoarthritis. No evidence for rheumatoid arthritis. Arnette Norris did not do it need to do a significant workup in that regard at this time. #2 bilateral carpal tunnel syndrome right greater than left discussed at length #3 right thenar tendinitis discussed plan try to cut down her hobbies involve gripping with the right hand. Nighttime short wrist Velcro splints. Rationale discussed. Anti-inflammatory medicine when necessary. 25 minutes spent most in discussion

## 2016-04-20 DIAGNOSIS — G5600 Carpal tunnel syndrome, unspecified upper limb: Secondary | ICD-10-CM | POA: Insufficient documentation

## 2016-06-16 HISTORY — PX: BREAST BIOPSY: SHX20

## 2016-12-18 ENCOUNTER — Ambulatory Visit (INDEPENDENT_AMBULATORY_CARE_PROVIDER_SITE_OTHER): Payer: Self-pay | Admitting: Nurse Practitioner

## 2016-12-18 ENCOUNTER — Encounter: Payer: Self-pay | Admitting: Nurse Practitioner

## 2016-12-18 VITALS — BP 144/90 | Temp 98.7°F | Ht 63.0 in | Wt 160.0 lb

## 2016-12-18 DIAGNOSIS — J300 Vasomotor rhinitis: Secondary | ICD-10-CM

## 2016-12-18 DIAGNOSIS — R350 Frequency of micturition: Secondary | ICD-10-CM

## 2016-12-18 LAB — POCT URINALYSIS DIPSTICK: pH, UA: 7 (ref 5.0–8.0)

## 2016-12-18 LAB — POCT GLUCOSE (DEVICE FOR HOME USE): POC Glucose: 101 mg/dl — AB (ref 70–99)

## 2016-12-18 MED ORDER — CIPROFLOXACIN HCL 500 MG PO TABS
500.0000 mg | ORAL_TABLET | Freq: Two times a day (BID) | ORAL | 0 refills | Status: DC
Start: 2016-12-18 — End: 2017-11-14

## 2016-12-18 NOTE — Progress Notes (Signed)
   Subjective:    Patient ID: Jane Baker, female    DOB: March 28, 1966, 51 y.o.   MRN: [id number]  HPI presents for urinary problems x 2 weeks. Went to Urgent care and given Augmentin which she completed. No relief. Urgency and frequency. Urgency and frequency. Pressure and voiding small amounts. Mid back pain x 5 d. No flank pain. No fever. Same sexual partner. No discharge. Bowels nl. Also off/on sore throat. No headache. Occasional ear pain. Runny nose. Non productive cough. Wheezing only if outside in the heat. No N/V. No GERD.     Review of Systems     Objective:   Physical Exam NAD. Alert, oriented. TMs clear effusion. Pharynx non erythematous with clear PND noted. Neck supple with mild anterior adenopathy. Lungs clear. Heart RRR. Abdomen soft, non tender. No CVA tenderness.  Results for orders placed or performed in visit on 12/18/16  POCT urinalysis dipstick  Result Value Ref Range   Color, UA     Clarity, UA     Glucose, UA     Bilirubin, UA     Ketones, UA     Spec Grav, UA <=1.005 (A) 1.010 - 1.025   Blood, UA     pH, UA 7.0 5.0 - 8.0   Protein, UA     Urobilinogen, UA  0.2 or 1.0 E.U./dL   Nitrite, UA     Leukocytes, UA  Negative  POCT Glucose (Device for Home Use)  Result Value Ref Range   Glucose Fasting, POC  70 - 99 mg/dL   POC Glucose 101 (A) 70 - 99 mg/dl   Urine micro neg.        Assessment & Plan:  Urinary frequency - Plan: POCT urinalysis dipstick, POCT Glucose (Device for Home Use)  Vasomotor rhinitis  Meds ordered this encounter  Medications  . ciprofloxacin (CIPRO) 500 MG tablet    Sig: Take 1 tablet (500 mg total) by mouth 2 (two) times daily.    Dispense:  14 tablet    Refill:  0    Order Specific Question:   Supervising Provider    Answer:   Mikey Kirschner [[id number]]   Patient is uninsured. Start antibiotic as directed. May take AZO for 48 hours then DC. Warning signs reviewed. Call back in 4-5 days if no improvement, sooner if worse.    Nasacort AQ as directed Allegra or claritin as directed

## 2016-12-18 NOTE — Patient Instructions (Addendum)
Nasacort AQ as directed Allegra or claritin as directed AZO for 48 hours then stop

## 2016-12-20 ENCOUNTER — Encounter: Payer: Self-pay | Admitting: Nurse Practitioner

## 2017-05-28 ENCOUNTER — Encounter: Payer: Self-pay | Admitting: Nurse Practitioner

## 2017-07-31 ENCOUNTER — Telehealth: Payer: Self-pay | Admitting: Family Medicine

## 2017-07-31 MED ORDER — ALBUTEROL SULFATE HFA 108 (90 BASE) MCG/ACT IN AERS: 2.0000 | INHALATION_SPRAY | Freq: Four times a day (QID) | RESPIRATORY_TRACT | 0 refills | Status: DC | PRN

## 2017-07-31 NOTE — Telephone Encounter (Signed)
Requesting refill on albuterol inhaler.   [facility] [place]

## 2017-07-31 NOTE — Telephone Encounter (Signed)
Prescription sent electronically to pharmacy. Patient notified. 

## 2017-11-01 ENCOUNTER — Encounter: Payer: Self-pay | Admitting: Nurse Practitioner

## 2017-11-03 ENCOUNTER — Telehealth: Payer: Self-pay

## 2017-11-03 ENCOUNTER — Encounter: Payer: Self-pay | Admitting: Family Medicine

## 2017-11-03 ENCOUNTER — Ambulatory Visit (INDEPENDENT_AMBULATORY_CARE_PROVIDER_SITE_OTHER): Payer: No Typology Code available for payment source | Admitting: Family Medicine

## 2017-11-03 ENCOUNTER — Other Ambulatory Visit: Payer: Self-pay | Admitting: Nurse Practitioner

## 2017-11-03 ENCOUNTER — Encounter: Payer: Self-pay | Admitting: Nurse Practitioner

## 2017-11-03 VITALS — BP 132/82 | Ht 63.0 in | Wt 162.8 lb

## 2017-11-03 DIAGNOSIS — R079 Chest pain, unspecified: Secondary | ICD-10-CM

## 2017-11-03 DIAGNOSIS — Z13220 Encounter for screening for lipoid disorders: Secondary | ICD-10-CM | POA: Diagnosis not present

## 2017-11-03 DIAGNOSIS — Z1231 Encounter for screening mammogram for malignant neoplasm of breast: Secondary | ICD-10-CM

## 2017-11-03 DIAGNOSIS — R1011 Right upper quadrant pain: Secondary | ICD-10-CM | POA: Diagnosis not present

## 2017-11-03 MED ORDER — PANTOPRAZOLE SODIUM 40 MG PO TBEC: 40.0000 mg | DELAYED_RELEASE_TABLET | Freq: Every day | ORAL | 3 refills | Status: DC

## 2017-11-03 NOTE — Telephone Encounter (Signed)
Patient left a message on the My chart requesting an appt for chest pain,reflux, and a raised spot on her upper back. She is requesting an appt for 11/05/2017 after 3 pm.I called and left a message asking for her to call our office to triage today 11/03/2017 at 11:58pm.

## 2017-11-03 NOTE — Telephone Encounter (Signed)
Patient states she was communicating with Hoyle Sauer and she had suggested that the pt try OTC reflux medication and she would need an appointment for the chest pain. Pt states the pain is mostly when she is having reflux and she is not exp at the moment but wanted to have checked out as her father has had open heart surgery. Patient coming in today for a check on the pain.

## 2017-11-03 NOTE — Progress Notes (Signed)
   Subjective:    Patient ID: Jane Baker, female    DOB: 1966-04-10, 52 y.o.   MRN: [id number] Patient arrives with numerous concerns HPI  Patient arrives with reflux and chest pain for 3 weeks off and on.  When eating has noted some discomfort substernal   Usually fafter eating  Bad ppain in the mid chest rad to the back and the shoulder  tums tried but did not help   No sig nausea    pao Pain last for twent min or so  Last wk had a bad spell, lasted hrs, very uncomfortable  Reflux not ta major issue down thru the yrs  ecept with pregnancy  Pt had gallbladder disease  Fa and sis when g bladder  Fa mid 60s had bypass Fa had hi bp   Pt has hi chol, tok zocor a while then stopped form leg camps    Not eeising much at this tie    Mostly painful in the eve after   Meals     Review of Systems No headache, no major weight loss or weight gain, no chest pain no back pain abdominal pain no change in bowel habits complete ROS otherwise negative     Objective:   Physical Exam  Alert and oriented, vitals reviewed and stable, NAD ENT-TM's and ext canals WNL bilat via otoscopic exam Soft palate, tonsils and post pharynx WNL via oropharyngeal exam Neck-symmetric, no masses; thyroid nonpalpable and nontender Pulmonary-no tachypnea or accessory muscle use; Clear without wheezes via auscultation Card--no abnrml murmurs, rhythm reg and rate WNL Carotid pulses symmetric, without bruits   EKG normal sinus rhythm.  No significant ST-T change   Some mild epigastric tenderness noted to deep palpation      Assessment & Plan:  1i impression postprandial substernal pain.  Sometimes radiating to the back.  Usually lasting 20 minutes sometimes as much as several hours.  Pain severe at times.  Moderately so.  No exertional pain.  Non-smoker.  EKG fine.  High likelihood either gastroesophageal reflux and/or gallstone disease.  Discussed Protonix initiated.  Gallbladder  ultrasound scheduled.  2.  Hyperlipidemia.  Separate discussion held.  Patient should consider getting back on meds with strong family history of heart disease  3.  Right posterior back lipoma.  Patient wishes to have it on await ultrasound result to see whether to schedule for both

## 2017-11-03 NOTE — Telephone Encounter (Signed)
ok 

## 2017-11-04 ENCOUNTER — Encounter: Payer: Self-pay | Admitting: Family Medicine

## 2017-11-05 LAB — LIPID PANEL
CHOL/HDL RATIO: 5.4 ratio — AB (ref 0.0–4.4)
Cholesterol, Total: 300 mg/dL — ABNORMAL HIGH (ref 100–199)
HDL: 56 mg/dL (ref >39)
LDL CALC: 215 mg/dL — AB (ref 0–99)
TRIGLYCERIDES: 145 mg/dL (ref 0–149)
VLDL Cholesterol Cal: 29 mg/dL (ref 5–40)

## 2017-11-05 LAB — BASIC METABOLIC PANEL
BUN/Creatinine Ratio: 13 (ref 9–23)
BUN: 11 mg/dL (ref 6–24)
CALCIUM: 10 mg/dL (ref 8.7–10.2)
CHLORIDE: 103 mmol/L (ref 96–106)
CO2: 23 mmol/L (ref 20–29)
Creatinine, Ser: 0.85 mg/dL (ref 0.57–1.00)
GFR calc Af Amer: 90 mL/min/{1.73_m2} (ref >59)
GFR calc non Af Amer: 78 mL/min/{1.73_m2} (ref >59)
Glucose: 93 mg/dL (ref 65–99)
POTASSIUM: 4.8 mmol/L (ref 3.5–5.2)
Sodium: 142 mmol/L (ref 134–144)

## 2017-11-05 LAB — HEPATIC FUNCTION PANEL
ALBUMIN: 4.5 g/dL (ref 3.5–5.5)
ALT: 39 IU/L — AB (ref 0–32)
AST: 25 IU/L (ref 0–40)
Alkaline Phosphatase: 105 IU/L (ref 39–117)
Bilirubin Total: 0.8 mg/dL (ref 0.0–1.2)
Bilirubin, Direct: 0.16 mg/dL (ref 0.00–0.40)
TOTAL PROTEIN: 7.4 g/dL (ref 6.0–8.5)

## 2017-11-06 ENCOUNTER — Encounter (HOSPITAL_COMMUNITY): Payer: Self-pay

## 2017-11-06 ENCOUNTER — Ambulatory Visit (HOSPITAL_COMMUNITY)
Admission: RE | Admit: 2017-11-06 | Discharge: 2017-11-06 | Disposition: A | Discharge status: Home / Self Care | Payer: 59 | Source: Ambulatory Visit | Attending: Nurse Practitioner | Admitting: Nurse Practitioner

## 2017-11-06 ENCOUNTER — Ambulatory Visit (HOSPITAL_COMMUNITY)
Admission: RE | Admit: 2017-11-06 | Discharge: 2017-11-06 | Disposition: A | Discharge status: Home / Self Care | Payer: 59 | Source: Ambulatory Visit | Attending: Family Medicine | Admitting: Family Medicine

## 2017-11-06 DIAGNOSIS — Z1231 Encounter for screening mammogram for malignant neoplasm of breast: Secondary | ICD-10-CM

## 2017-11-06 DIAGNOSIS — R1011 Right upper quadrant pain: Secondary | ICD-10-CM | POA: Insufficient documentation

## 2017-11-14 ENCOUNTER — Encounter (HOSPITAL_COMMUNITY): Payer: Self-pay | Admitting: Emergency Medicine

## 2017-11-14 ENCOUNTER — Other Ambulatory Visit: Payer: Self-pay

## 2017-11-14 ENCOUNTER — Emergency Department (HOSPITAL_COMMUNITY): Payer: 59

## 2017-11-14 ENCOUNTER — Emergency Department (HOSPITAL_COMMUNITY)
Admission: EM | Admit: 2017-11-14 | Discharge: 2017-11-14 | Disposition: A | Discharge status: Home / Self Care | Payer: 59 | Attending: Emergency Medicine | Admitting: Emergency Medicine

## 2017-11-14 DIAGNOSIS — R0789 Other chest pain: Secondary | ICD-10-CM

## 2017-11-14 DIAGNOSIS — Z79899 Other long term (current) drug therapy: Secondary | ICD-10-CM | POA: Diagnosis not present

## 2017-11-14 DIAGNOSIS — R079 Chest pain, unspecified: Secondary | ICD-10-CM | POA: Diagnosis present

## 2017-11-14 LAB — BASIC METABOLIC PANEL
ANION GAP: 11 (ref 5–15)
BUN: 12 mg/dL (ref 6–20)
CHLORIDE: 100 mmol/L — AB (ref 101–111)
CO2: 27 mmol/L (ref 22–32)
Calcium: 10.1 mg/dL (ref 8.9–10.3)
Creatinine, Ser: 0.9 mg/dL (ref 0.44–1.00)
GFR calc non Af Amer: >60 mL/min (ref >60)
Glucose, Bld: 111 mg/dL — ABNORMAL HIGH (ref 65–99)
POTASSIUM: 3.8 mmol/L (ref 3.5–5.1)
Sodium: 138 mmol/L (ref 135–145)

## 2017-11-14 LAB — CBC
HCT: 41.9 % (ref 36.0–46.0)
HEMOGLOBIN: 14.1 g/dL (ref 12.0–15.0)
MCH: 29.6 pg (ref 26.0–34.0)
MCHC: 33.7 g/dL (ref 30.0–36.0)
MCV: 88 fL (ref 78.0–100.0)
Platelets: 308 10*3/uL (ref 150–400)
RBC: 4.76 MIL/uL (ref 3.87–5.11)
RDW: 12.4 % (ref 11.5–15.5)
WBC: 8.9 10*3/uL (ref 4.0–10.5)

## 2017-11-14 LAB — I-STAT BETA HCG BLOOD, ED (MC, WL, AP ONLY): I-stat hCG, quantitative: <5 m[IU]/mL (ref <5)

## 2017-11-14 LAB — TROPONIN I

## 2017-11-14 LAB — D-DIMER, QUANTITATIVE (NOT AT ARMC): D DIMER QUANT: 0.3 ug{FEU}/mL (ref 0.00–0.50)

## 2017-11-14 MED ORDER — ASPIRIN 81 MG PO CHEW
324.0000 mg | CHEWABLE_TABLET | Freq: Once | ORAL | Status: AC
  Administered 2017-11-14: 324 mg via ORAL
  Filled 2017-11-14: qty 4

## 2017-11-14 MED ORDER — FAMOTIDINE IN NACL 20-0.9 MG/50ML-% IV SOLN
20.0000 mg | Freq: Once | INTRAVENOUS | Status: AC
  Administered 2017-11-14: 20 mg via INTRAVENOUS
  Filled 2017-11-14 (×2): qty 50

## 2017-11-14 MED ORDER — GI COCKTAIL ~~LOC~~
30.0000 mL | Freq: Once | ORAL | Status: AC
  Administered 2017-11-14: 30 mL via ORAL
  Filled 2017-11-14: qty 30

## 2017-11-14 MED ORDER — FAMOTIDINE 20 MG PO TABS: 20.0000 mg | ORAL_TABLET | Freq: Two times a day (BID) | ORAL | 0 refills | Status: DC

## 2017-11-14 NOTE — Discharge Instructions (Addendum)
Eat a bland diet, avoiding greasy, fatty, fried foods, as well as spicy and acidic foods or beverages.  Avoid eating within 2 to 3 hours before going to bed or laying down.  Also avoid teas, colas, coffee, chocolate, pepermint and spearment.  Take the prescription as directed.  May also take over the counter maalox/mylanta, as directed on packaging, as needed for discomfort. Apply moist heat or ice to the area(s) of discomfort, for 15 minutes at a time, several times per day for the next few days.  Do not fall asleep on a heating or ice pack.  Call your regular medical doctor on Monday to schedule a follow up appointment this week. Call the GI doctor on Monday to schedule a follow up appointment within the next week.  Return to the Emergency Department immediately if worsening.

## 2017-11-14 NOTE — ED Triage Notes (Signed)
Patient c/o L chest, chest. And back pain that started last night. SOB and dizziness, lightheadedness this am.

## 2017-11-14 NOTE — ED Provider Notes (Signed)
[facility] EMERGENCY DEPARTMENT Provider Note   CSN: [id number] Arrival date & time: 11/14/17  1116     History   Chief Complaint Chief Complaint  Patient presents with  . Chest Pain    HPI Jane Baker is a 52 y.o. female.   Chest Pain      Pt was seen at 1135. Per pt, c/o gradual onset and persistence of constant mid-sternal chest "pain" that began last night approximately 2100. Describes the pain as "constant" and "stabbing." Pt states she has had this pain intermittently for the past 2 to 3 weeks. Pain lasts for several hours each episode. Pain can worsen after eating. Pain will occasionally radiate into her back and be associated with SOB. Pt was evaluated by her PMD for this complaint with "negative EKG and blood work," and Surveyor, minerals. Denies abd pain, no N/V/D, no cough, no palpitations, no fevers, no rash, no injury.   Past Medical History:  Diagnosis Date  . Hx estrogen therapy 12/30/2012    Patient Active Problem List   Diagnosis Date Noted  . Carpal tunnel syndrome 04/20/2016  . Hyperlipidemia 01/31/2013  . 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 Hx estrogen therapy 12/30/2012    Past Surgical History:  Procedure Laterality Date  . ABDOMINAL HYSTERECTOMY    . BILATERAL SALPINGOOPHORECTOMY       OB History    Gravida  2   Para  2   Term      Preterm      AB      Living  2     SAB      TAB      Ectopic      Multiple      Live Births  2            Home Medications    Prior to Admission medications   Medication Sig Start Date End Date Taking? Authorizing Provider  albuterol (PROVENTIL HFA;VENTOLIN HFA) 108 (90 Base) MCG/ACT inhaler Inhale 2 puffs into the lungs every 6 (six) hours as needed for wheezing. 07/31/17  Yes Mikey Kirschner, MD  pantoprazole (PROTONIX) 40 MG tablet Take 1 tablet (40 mg total) by mouth daily. 11/03/17  Yes Mikey Kirschner, MD    Family  History Family History  Problem Relation Age of Onset  . Hyperlipidemia Mother   . Hypertension Father   . Heart disease Father   . Hyperlipidemia Father   . Cancer Paternal Grandmother   . Heart disease Paternal Grandmother   . Cancer Sister 54       died bladder and vaginal cancer    Social History Social History   Tobacco Use  . Smoking status: Never Smoker  . Smokeless tobacco: Never Used  Substance Use Topics  . Alcohol use: No  . Drug use: No     Allergies   Bee venom   Review of Systems Review of Systems  Cardiovascular: Positive for chest pain.  ROS: Statement: All systems negative except as marked or noted in the HPI; Constitutional: Negative for fever and chills. ; ; Eyes: Negative for eye pain, redness and discharge. ; ; ENMT: Negative for ear pain, hoarseness, nasal congestion, sinus pressure and sore throat. ; ; Cardiovascular: +CP, mild SOB. Negative for palpitations, diaphoresis, and peripheral edema. ; ; Respiratory: Negative for cough, wheezing and stridor. ; ; Gastrointestinal: Negative for nausea, vomiting, diarrhea, abdominal pain, blood in stool, hematemesis, jaundice and rectal bleeding. . ; ;  Genitourinary: Negative for dysuria, flank pain and hematuria. ; ; Musculoskeletal: Negative for back pain and neck pain. Negative for swelling and trauma.; ; Skin: Negative for pruritus, rash, abrasions, blisters, bruising and skin lesion.; ; Neuro: Negative for headache, lightheadedness and neck stiffness. Negative for weakness, altered level of consciousness, altered mental status, extremity weakness, paresthesias, involuntary movement, seizure and syncope.       Physical Exam Updated Vital Signs BP 125/87   Pulse 80   Temp 98.4 F (36.9 C) (Oral)   Resp 14   Ht 5\' 3"  (1.6 m)   Wt 73.5 kg (162 lb)   SpO2 98%   BMI 28.70 kg/m   Physical Exam 1140: Physical examination:  Nursing notes reviewed; Vital signs and O2 SAT reviewed;  Constitutional: Well  developed, Well nourished, Well hydrated, In no acute distress; Head:  Normocephalic, atraumatic; Eyes: EOMI, PERRL, No scleral icterus; ENMT: Mouth and pharynx normal, Mucous membranes moist; Neck: Supple, Full range of motion, No lymphadenopathy; Cardiovascular: Regular rate and rhythm, No gallop; Respiratory: Breath sounds clear & equal bilaterally, No wheezes.  Speaking full sentences with ease, Normal respiratory effort/excursion; Chest: Nontender, Movement normal; Abdomen: Soft, Nontender, Nondistended, Normal bowel sounds; Genitourinary: No CVA tenderness; Extremities: Peripheral pulses normal, No tenderness, No edema, No calf edema or asymmetry.; Neuro: AA&Ox3, Major CN grossly intact.  Speech clear. No gross focal motor or sensory deficits in extremities.; Skin: Color normal, Warm, Dry.; Psych:  Anxious.     ED Treatments / Results  Labs (all labs ordered are listed, but only abnormal results are displayed)   EKG EKG Interpretation  Date/Time:  Saturday November 14 2017 11:23:02 EDT Ventricular Rate:  79 PR Interval:    QRS Duration: 90 QT Interval:  379 QTC Calculation: 435 R Axis:   72 Text Interpretation:  Sinus rhythm RSR' in V1 or V2, probably normal variant Artifact No old tracing to compare Confirmed by Francine Graven [phone]) on 11/14/2017 1:33:07 PM   Radiology   Procedures Procedures (including critical care time)  Medications Ordered in ED Medications  gi cocktail (Maalox,Lidocaine,Donnatal) (30 mLs Oral Given 11/14/17 1304)  aspirin chewable tablet 324 mg (324 mg Oral Given 11/14/17 1305)  famotidine (PEPCID) IVPB 20 mg premix (0 mg Intravenous Stopped 11/14/17 1337)     Initial Impression / Assessment and Plan / ED Course  I have reviewed the triage vital signs and the nursing notes.  Pertinent labs & imaging results that were available during my care of the patient were reviewed by me and considered in my medical decision making (see chart for  details).  MDM Reviewed: previous chart, nursing note and vitals Reviewed previous: labs Interpretation: labs, ECG and x-ray   Results for orders placed or performed during the hospital encounter of 76/54/65  Basic metabolic panel  Result Value Ref Range   Sodium 138 135 - 145 mmol/L   Potassium 3.8 3.5 - 5.1 mmol/L   Chloride 100 (L) 101 - 111 mmol/L   CO2 27 22 - 32 mmol/L   Glucose, Bld 111 (H) 65 - 99 mg/dL   BUN 12 6 - 20 mg/dL   Creatinine, Ser 0.90 0.44 - 1.00 mg/dL   Calcium 10.1 8.9 - 10.3 mg/dL   GFR calc non Af Amer >60 >60 mL/min   GFR calc Af Amer >60 >60 mL/min   Anion gap 11 5 - 15  CBC  Result Value Ref Range   WBC 8.9 4.0 - 10.5 K/uL   RBC 4.76 3.87 -  5.11 MIL/uL   Hemoglobin 14.1 12.0 - 15.0 g/dL   HCT 41.9 36.0 - 46.0 %   MCV 88.0 78.0 - 100.0 fL   MCH 29.6 26.0 - 34.0 pg   MCHC 33.7 30.0 - 36.0 g/dL   RDW 12.4 11.5 - 15.5 %   Platelets 308 150 - 400 K/uL  D-dimer, quantitative  Result Value Ref Range   D-Dimer, Quant 0.30 0.00 - 0.50 ug/mL-FEU  Troponin I  Result Value Ref Range   Troponin I <0.03 <0.03 ng/mL  Troponin I  Result Value Ref Range   Troponin I <0.03 <0.03 ng/mL  I-Stat beta hCG blood, ED  Result Value Ref Range   I-stat hCG, quantitative <5.0 <5 mIU/mL   Comment 3           Dg Chest 2 View Result Date: 11/14/2017 CLINICAL DATA:  Intermittent chest pain x2 weeks with SOB and dizziness. Patient states that it was originally in the center of her chest but for 2 days has moved to the left side. Patient states heaviness in left arm when having chest pain. Hx of asthma. EXAM: CHEST - 2 VIEW COMPARISON:  10/31/2014 FINDINGS: Heart, mediastinum and hila are unremarkable. Mild prominence of the bronchovascular markings, stable. Lungs are clear. No pleural effusion or pneumothorax. Skeletal structures are intact. IMPRESSION: No active cardiopulmonary disease. Electronically Signed   By: Lajean Manes M.D.   On: 11/14/2017 12:02    US Abdomen  Limited Ruq Result Date: 11/06/2017 CLINICAL DATA:  Intermittent right upper abdominal pain x3 weeks EXAM: ULTRASOUND ABDOMEN LIMITED RIGHT UPPER QUADRANT COMPARISON:  None. FINDINGS: Gallbladder: No gallstones or wall thickening visualized. No sonographic Murphy sign noted by sonographer. Common bile duct: Diameter: 3.6 mm, unremarkable Liver: No focal lesion identified. Within normal limits in parenchymal echogenicity. Portal vein is patent on color Doppler imaging with normal direction of blood flow towards the liver. IMPRESSION: Normal Electronically Signed   By: Lucrezia Europe M.D.   On: 11/06/2017 08:52     1445:   Doubt PE as cause for symptoms with normal d-dimer and low risk [place].  Doubt ACS as cause for symptoms with normal troponin x2 and EKG without acute STTW changes after 2 to 3 weeks of intermittent pain and 2 days of constant symptoms. Tx symptomatically, f/u PMD and GI MD. Dx and testing d/w pt and family.  Questions answered.  Verb understanding, agreeable to d/c home with outpt f/u.     Final Clinical Impressions(s) / ED Diagnoses   Final diagnoses:  Chest wall pain    ED Discharge Orders    None       Francine Graven, DO 11/18/17 [id number]

## 2017-11-17 ENCOUNTER — Encounter (INDEPENDENT_AMBULATORY_CARE_PROVIDER_SITE_OTHER): Payer: Self-pay

## 2017-11-17 ENCOUNTER — Encounter: Payer: Self-pay | Admitting: Family Medicine

## 2017-11-17 ENCOUNTER — Ambulatory Visit (INDEPENDENT_AMBULATORY_CARE_PROVIDER_SITE_OTHER): Payer: No Typology Code available for payment source | Admitting: Family Medicine

## 2017-11-17 VITALS — BP 110/92 | Ht 63.0 in | Wt 160.1 lb

## 2017-11-17 DIAGNOSIS — K219 Gastro-esophageal reflux disease without esophagitis: Secondary | ICD-10-CM

## 2017-11-17 DIAGNOSIS — E7800 Pure hypercholesterolemia, unspecified: Secondary | ICD-10-CM

## 2017-11-17 DIAGNOSIS — Z79899 Other long term (current) drug therapy: Secondary | ICD-10-CM

## 2017-11-17 MED ORDER — SUCRALFATE 1 G PO TABS: ORAL_TABLET | ORAL | 1 refills | Status: DC

## 2017-11-17 MED ORDER — ATORVASTATIN CALCIUM 20 MG PO TABS: ORAL_TABLET | ORAL | 5 refills | Status: DC

## 2017-11-17 NOTE — Progress Notes (Signed)
   Subjective:    Patient ID: Jane Baker, female    DOB: Feb 12, 1966, 52 y.o.   MRN: [id number] Patient arrives for follow-up of numerous concerns HPI Patient is here today to follow up on lab work.She states she thinks her cholesterol was abnormal.  This is been initially on the past.  Patient mostly watches her diet but not particularly closely.    And she went to ed on this past Sat am for chest pain. They did extensive testing on her.She states they think it was her reflux. She states she thinks it is better,but not gone.  Patient experienced near complete resolution relief with GI cocktail at the emergency room of note had gallbladder ultrasound several weeks ago negative.  No exertional chest pain.  Claims compliance with Protonix.  ER doctor added to blocker very compliant.  Still having upper abdominal discomfort.  Also a sense of something catching when she is trying to swallow.  No frank vomiting or frank obstruction but definitely since if things are not improving well   Review of Systems No headache, no major weight loss or weight gain, no chest pain no back pain abdominal pain no change in bowel habits complete ROS otherwise negative     Objective:   Physical Exam Alert and oriented, vitals reviewed and stable, NAD ENT-TM's and ext canals WNL bilat via otoscopic exam Soft palate, tonsils and post pharynx WNL via oropharyngeal exam Neck-symmetric, no masses; thyroid nonpalpable and nontender Pulmonary-no tachypnea or accessory muscle use; Clear without wheezes via auscultation Card--no abnrml murmurs, rhythm reg and rate WNL Carotid pulses symmetric, without bruits  distinct epigastric tenderness to palpation       Assessment & Plan:  1 impression status post ER visit for chest pain likely reflux with secondary spasm.  Discussed.  Cannot 100% rule out gallbladder disease but feel this is quite unlikely with element of dysphagia time for GI referral.  Discussed.  Add  Carafate pre-meals GI referral recommended  2.  Hyperlipidemia LDL way too high.  Discussed.  Definitely needs medication rationale discussed.  Side effects benefits discussed  Greater than 50% of this 25 minute face to face visit was spent in counseling and discussion and coordination of care regarding the above diagnosis/diagnosies  Check lipid liver in several months/advised patient would like to see her every 6 months going forward

## 2017-11-30 ENCOUNTER — Encounter (INDEPENDENT_AMBULATORY_CARE_PROVIDER_SITE_OTHER): Payer: Self-pay | Admitting: Internal Medicine

## 2017-11-30 ENCOUNTER — Ambulatory Visit (INDEPENDENT_AMBULATORY_CARE_PROVIDER_SITE_OTHER): Payer: 59 | Admitting: Internal Medicine

## 2017-11-30 DIAGNOSIS — R1011 Right upper quadrant pain: Secondary | ICD-10-CM

## 2017-11-30 NOTE — Patient Instructions (Signed)
HIDA scan.

## 2017-11-30 NOTE — Progress Notes (Addendum)
   Subjective:    Patient ID: Jane Baker, female    DOB: [birth year], 52 y.o.   MRN: [id number]  HPI Referred by Dr. Baltazar Apo for GERD.   Seen in the ED 11/14/2017 for chest pain, worse after eating. Radiated into back with SOB. Normal troponin x 2 and EKGs without acute changes.  Saw Dr. Wolfgang Phoenix on 11/03/2017 and started on Protonix and Carafate.  She says the Carafate did not help.  She says since starting the Protonix, she feels better. She says when the pain, occurs she will have chest pain and she will have pain in RUQ. The pain not related to eating.  She says she has some indigestion. When the pain occurs, it will radiate into her back.  All her symptoms started in May. Has had 4 episodes of pain lasting approximately 4 hours.  No nausea or vomiting. Appetite is good. No weight loss.  She has been constipated lately. No melena or BRRB.    Family hx of uterine, colon and bladder cancer in her 49s (Mother). (Primary site: uterus). Lynch syndrome ruled out with her sister's.   11/06/2017 [place] RUQ: rt upper quadrant pain: Normal.  11/14/2017 Chest Xray: normal.  CBD 3.22m.  Hepatic Function Latest Ref Rng & Units 11/04/2017 08/20/2015 05/19/2013  Total Protein 6.0 - 8.5 g/dL 7.4 7.2 7.2  Albumin 3.5 - 5.5 g/dL 4.5 4.4 4.2  AST 0 - 40 IU/L '25 22 28  '$ ALT 0 - 32 IU/L 39(H) 21 31  Alk Phosphatase 39 - 117 IU/L 105 101 86  Total Bilirubin 0.0 - 1.2 mg/dL 0.8 0.3 0.9  Bilirubin, Direct 0.00 - 0.40 mg/dL 0.16 0.09 -      Review of Systems Past Medical History:  Diagnosis Date  . Hx estrogen therapy 12/30/2012    Past Surgical History:  Procedure Laterality Date  . ABDOMINAL HYSTERECTOMY    . BILATERAL SALPINGOOPHORECTOMY      Allergies  Allergen Reactions  . Bee Venom     Current Outpatient Medications on File Prior to Visit  Medication Sig Dispense Refill  . albuterol (PROVENTIL HFA;VENTOLIN HFA) 108 (90 Base) MCG/ACT inhaler Inhale 2 puffs into the lungs every 6 (six) hours  as needed for wheezing. 1 Inhaler 0  . atorvastatin (LIPITOR) 20 MG tablet One po QHS 30 tablet 5  . pantoprazole (PROTONIX) 40 MG tablet Take 1 tablet (40 mg total) by mouth daily. 30 tablet 3   No current facility-administered medications on file prior to visit.         Objective:   Physical Exam Blood pressure (!) 150/90, pulse 68, temperature 98.1 F (36.7 C), height '5\' 3"'$  (1.6 m), weight 163 lb 11.2 oz (74.3 kg). Alert and oriented. Skin warm and dry. Oral mucosa is moist.   . Sclera anicteric, conjunctivae is pink. Thyroid not enlarged. No cervical lymphadenopathy. Lungs clear. Heart regular rate and rhythm.  Abdomen is soft. Bowel sounds are positive. No hepatomegaly. No abdominal masses felt. No tenderness.  No edema to lower extremities.           Assessment & Plan:  HIDA scan. May need an EGD if HIDA scan is normal.

## 2017-12-01 ENCOUNTER — Other Ambulatory Visit: Payer: Self-pay | Admitting: Family Medicine

## 2017-12-03 ENCOUNTER — Encounter (HOSPITAL_COMMUNITY)
Admission: RE | Admit: 2017-12-03 | Discharge: 2017-12-03 | Disposition: A | Discharge status: Home / Self Care | Payer: 59 | Source: Ambulatory Visit | Attending: Internal Medicine | Admitting: Internal Medicine

## 2017-12-03 DIAGNOSIS — R1011 Right upper quadrant pain: Secondary | ICD-10-CM | POA: Diagnosis present

## 2017-12-03 MED ORDER — TECHNETIUM TC 99M MEBROFENIN IV KIT
5.0000 | PACK | Freq: Once | INTRAVENOUS | Status: AC | PRN
  Administered 2017-12-03: 5.2 via INTRAVENOUS

## 2017-12-09 ENCOUNTER — Other Ambulatory Visit (INDEPENDENT_AMBULATORY_CARE_PROVIDER_SITE_OTHER): Payer: Self-pay | Admitting: Internal Medicine

## 2017-12-09 ENCOUNTER — Telehealth (INDEPENDENT_AMBULATORY_CARE_PROVIDER_SITE_OTHER): Payer: Self-pay | Admitting: Internal Medicine

## 2017-12-09 DIAGNOSIS — R0789 Other chest pain: Secondary | ICD-10-CM

## 2017-12-09 DIAGNOSIS — R1013 Epigastric pain: Secondary | ICD-10-CM

## 2017-12-09 NOTE — Telephone Encounter (Signed)
Ann, EGD 

## 2017-12-10 ENCOUNTER — Encounter (INDEPENDENT_AMBULATORY_CARE_PROVIDER_SITE_OTHER): Payer: Self-pay | Admitting: *Deleted

## 2017-12-10 DIAGNOSIS — R0789 Other chest pain: Secondary | ICD-10-CM | POA: Insufficient documentation

## 2017-12-10 DIAGNOSIS — R1013 Epigastric pain: Secondary | ICD-10-CM | POA: Insufficient documentation

## 2017-12-10 NOTE — Telephone Encounter (Signed)
EGD sch'd 01/11/18 at 1 (12), patient aware, instructions mailed

## 2017-12-21 ENCOUNTER — Encounter: Payer: Self-pay | Admitting: Family Medicine

## 2017-12-21 ENCOUNTER — Telehealth: Payer: Self-pay | Admitting: Family Medicine

## 2017-12-21 ENCOUNTER — Other Ambulatory Visit: Payer: Self-pay | Admitting: Family Medicine

## 2017-12-21 DIAGNOSIS — D179 Benign lipomatous neoplasm, unspecified: Secondary | ICD-10-CM

## 2017-12-21 NOTE — Telephone Encounter (Signed)
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 Do we need to do a referral to dermatology or general surgery? Or does she need to come in so provider can look at it and determine which speciality. Please advise. Thank you

## 2017-12-21 NOTE — Telephone Encounter (Signed)
Either can do usually gen surg

## 2017-12-22 NOTE — Addendum Note (Signed)
Addended by: Carmelina Noun on: 12/22/2017 08:12 AM   Modules accepted: Orders

## 2017-12-22 NOTE — Telephone Encounter (Signed)
Referral put in. Pt notified.  

## 2017-12-30 ENCOUNTER — Encounter: Payer: Self-pay | Admitting: Family Medicine

## 2017-12-30 ENCOUNTER — Encounter (INDEPENDENT_AMBULATORY_CARE_PROVIDER_SITE_OTHER): Payer: Self-pay

## 2018-01-11 ENCOUNTER — Encounter (HOSPITAL_COMMUNITY): Payer: Self-pay | Admitting: *Deleted

## 2018-01-11 ENCOUNTER — Encounter (HOSPITAL_COMMUNITY)
Admission: RE | Disposition: A | Discharge status: Home / Self Care | Payer: Self-pay | Source: Ambulatory Visit | Attending: Internal Medicine

## 2018-01-11 ENCOUNTER — Ambulatory Visit (HOSPITAL_COMMUNITY)
Admission: RE | Admit: 2018-01-11 | Discharge: 2018-01-11 | Disposition: A | Discharge status: Home / Self Care | Payer: 59 | Source: Ambulatory Visit | Attending: Internal Medicine | Admitting: Internal Medicine

## 2018-01-11 ENCOUNTER — Other Ambulatory Visit: Payer: Self-pay

## 2018-01-11 DIAGNOSIS — R1013 Epigastric pain: Secondary | ICD-10-CM

## 2018-01-11 DIAGNOSIS — Z79899 Other long term (current) drug therapy: Secondary | ICD-10-CM | POA: Diagnosis not present

## 2018-01-11 DIAGNOSIS — E7800 Pure hypercholesterolemia, unspecified: Secondary | ICD-10-CM | POA: Diagnosis not present

## 2018-01-11 DIAGNOSIS — K228 Other specified diseases of esophagus: Secondary | ICD-10-CM

## 2018-01-11 DIAGNOSIS — J45909 Unspecified asthma, uncomplicated: Secondary | ICD-10-CM | POA: Insufficient documentation

## 2018-01-11 DIAGNOSIS — R079 Chest pain, unspecified: Secondary | ICD-10-CM

## 2018-01-11 DIAGNOSIS — R0789 Other chest pain: Secondary | ICD-10-CM | POA: Insufficient documentation

## 2018-01-11 HISTORY — DX: Unspecified asthma, uncomplicated: J45.909

## 2018-01-11 HISTORY — DX: Pure hypercholesterolemia, unspecified: E78.00

## 2018-01-11 HISTORY — PX: ESOPHAGOGASTRODUODENOSCOPY: SHX5428

## 2018-01-11 SURGERY — EGD (ESOPHAGOGASTRODUODENOSCOPY)
Anesthesia: Moderate Sedation

## 2018-01-11 MED ORDER — LIDOCAINE VISCOUS HCL 2 % MT SOLN
OROMUCOSAL | Status: AC
  Filled 2018-01-11: qty 15

## 2018-01-11 MED ORDER — SODIUM CHLORIDE 0.9 % IV SOLN
INTRAVENOUS | Status: DC
  Administered 2018-01-11: 12:00:00 via INTRAVENOUS

## 2018-01-11 MED ORDER — MEPERIDINE HCL 50 MG/ML IJ SOLN
INTRAMUSCULAR | Status: DC | PRN
  Administered 2018-01-11 (×2): 25 mg via INTRAVENOUS

## 2018-01-11 MED ORDER — MIDAZOLAM HCL 5 MG/5ML IJ SOLN
INTRAMUSCULAR | Status: DC | PRN
  Administered 2018-01-11: 2 mg via INTRAVENOUS
  Administered 2018-01-11: 1 mg via INTRAVENOUS
  Administered 2018-01-11: 2 mg via INTRAVENOUS

## 2018-01-11 MED ORDER — FAMOTIDINE 20 MG PO TABS: 20.0000 mg | ORAL_TABLET | Freq: Every day | ORAL | Status: DC

## 2018-01-11 MED ORDER — LIDOCAINE VISCOUS HCL 2 % MT SOLN
OROMUCOSAL | Status: DC | PRN
  Administered 2018-01-11: 1 via OROMUCOSAL

## 2018-01-11 MED ORDER — MIDAZOLAM HCL 5 MG/5ML IJ SOLN
INTRAMUSCULAR | Status: AC
  Filled 2018-01-11: qty 10

## 2018-01-11 MED ORDER — MEPERIDINE HCL 50 MG/ML IJ SOLN
INTRAMUSCULAR | Status: AC
  Filled 2018-01-11: qty 1

## 2018-01-11 MED ORDER — STERILE WATER FOR IRRIGATION IR SOLN
Status: DC | PRN
  Administered 2018-01-11: 13:00:00

## 2018-01-11 NOTE — Op Note (Signed)
[facility] Patient Name: Jane Baker Procedure Date: 01/11/2018 12:37 PM MRN: [id number] Date of Birth: 02-01-1966 Attending MD: Hildred Laser , MD CSN: [id number] Age: 52 Admit Type: Outpatient Procedure:                Upper GI endoscopy Indications:              Epigastric abdominal pain, Unexplained chest pain Providers:                Hildred Laser, MD, Hinton Rao, RN, Aram Candela Referring MD:             Grace Bushy. Wolfgang Phoenix, MD Medicines:                Lidocaine spray, Meperidine 50 mg IV, Midazolam 5                            mg IV Complications:            No immediate complications. Estimated Blood Loss:     Estimated blood loss: none. Procedure:                Pre-Anesthesia Assessment:                           - Prior to the procedure, a History and Physical                            was performed, and patient medications and                            allergies were reviewed. The patient's tolerance of                            previous anesthesia was also reviewed. The risks                            and benefits of the procedure and the sedation                            options and risks were discussed with the patient.                            All questions were answered, and informed consent                            was obtained. Prior Anticoagulants: The patient has                            taken no previous anticoagulant or antiplatelet                            agents. ASA Grade Assessment: II - A patient with                            mild systemic disease. After reviewing the risks  and benefits, the patient was deemed in                            satisfactory condition to undergo the procedure.                           After obtaining informed consent, the endoscope was                            passed under direct vision. Throughout the                            procedure, the patient's blood pressure,  pulse, and                            oxygen saturations were monitored continuously. The                            [id number]-[id number] ([id number]) was introduced through the                            mouth, and advanced to the second part of duodenum.                            The upper GI endoscopy was accomplished without                            difficulty. The patient tolerated the procedure                            well. Scope In: 1:42:57 PM Scope Out: 1:48:17 PM Total Procedure Duration: 0 hours 5 minutes 20 seconds  Findings:      The examined esophagus was normal.      The Z-line was irregular and was found 39 cm from the incisors.      The entire examined stomach was normal.      The duodenal bulb and second portion of the duodenum were normal. Impression:               - Normal esophagus.                           - Z-line irregular, 39 cm from the incisors.                           - Normal stomach.                           - Normal duodenal bulb and second portion of the                            duodenum.                           - No specimens collected. Moderate Sedation:      Moderate (conscious) sedation was administered by the endoscopy  nurse       and supervised by the endoscopist. The following parameters were       monitored: oxygen saturation, heart rate, blood pressure, CO2       capnography and response to care. Total physician intraservice time was       10 minutes. Recommendation:           - Patient has a contact number available for                            emergencies. The signs and symptoms of potential                            delayed complications were discussed with the                            patient. Return to normal activities tomorrow.                            Written discharge instructions were provided to the                            patient.                           - Resume previous diet today.                           - Continue  present medications.                           - Famotidine OTC 20 mg po qhs.                           - Symptom diary until office visit in 6 weeks. Procedure Code(s):        --- Professional ---                           [phone], Esophagogastroduodenoscopy, flexible,                            transoral; diagnostic, including collection of                            specimen(s) by brushing or washing, when performed                            (separate procedure)                           G0500, Moderate sedation services provided by the                            same physician or other qualified health care                            professional performing a gastrointestinal  endoscopic service that sedation supports,                            requiring the presence of an independent trained                            observer to assist in the monitoring of the                            patient's level of consciousness and physiological                            status; initial 15 minutes of intra-service time;                            patient age 62 years or older (additional time may                            be reported with [phone], as appropriate) Diagnosis Code(s):        --- Professional ---                           K22.8, Other specified diseases of esophagus                           R10.13, Epigastric pain                           R07.9, Chest pain, unspecified CPT copyright 2017 American Medical Association. All rights reserved. The codes documented in this report are preliminary and upon coder review may  be revised to meet current compliance requirements. Hildred Laser, MD Hildred Laser, MD 01/11/2018 1:56:50 PM This report has been signed electronically. Number of Addenda: 0

## 2018-01-11 NOTE — Discharge Instructions (Signed)
Resume usual medications as before. Pepcid or famotidine OTC 20 mg daily at bedtime. Resume usual diet. No driving for 24 hours. Keep symptom diary until office visit in 6 weeks( with Dr. Laural Golden).      Esophagogastroduodenoscopy, Care After Refer to this sheet in the next few weeks. These instructions provide you with information about caring for yourself after your procedure. Your health care provider may also give you more specific instructions. Your treatment has been planned according to current medical practices, but problems sometimes occur. Call your health care provider if you have any problems or questions after your procedure. What can I expect after the procedure? After the procedure, it is common to have:  A sore throat.  Nausea.  Bloating.  Dizziness.  Fatigue.  Follow these instructions at home:  Do not eat or drink anything until the numbing medicine (local anesthetic) has worn off and your gag reflex has returned. You will know that the local anesthetic has worn off when you can swallow comfortably.  Do not drive for 24 hours if you received a medicine to help you relax (sedative).  If your health care provider took a tissue sample for testing during the procedure, make sure to get your test results. This is your responsibility. Ask your health care provider or the department performing the test when your results will be ready.  Keep all follow-up visits as told by your health care provider. This is important. Contact a health care provider if:  You cannot stop coughing.  You are not urinating.  You are urinating less than usual. Get help right away if:  You have trouble swallowing.  You cannot eat or drink.  You have throat or chest pain that gets worse.  You are dizzy or light-headed.  You faint.  You have nausea or vomiting.  You have chills.  You have a fever.  You have severe abdominal pain.  You have black, tarry, or bloody  stools. This information is not intended to replace advice given to you by your health care provider. Make sure you discuss any questions you have with your health care provider. Document Released: 05/19/2012 Document Revised: 11/08/2015 Document Reviewed: 04/26/2015 Elsevier Interactive Patient Education  [employer].

## 2018-01-11 NOTE — H&P (Signed)
Jane Baker is an 52 y.o. female.   Chief Complaint: Patient is here for EGD. HPI: Patient is a 52 year old Caucasian female who is been having intermittent midepigastric and retrosternal pain.  She saw Dr. Wolfgang Phoenix underwent upper abdominal ultrasound was negative for cholelithiasis.  She presented to emergency room on 11/14/2017 with chest pain.  Troponin level x2 were negative and EKG was normal.  She was found to have known cardiac chest pain. Patient was seen in the office underwent HIDA scan with CCK.  Ejection fraction was 89%.  Study was felt to be normal. Time she has noted pain on the right rib cage.  She states that she has been on pantoprazole is not had any more chest or epigastric pain but she still has feeling of lump in her throat.  She denies dysphagia nausea vomiting or melena.  She does not smoke cigarettes or drink alcohol.  Past Medical History:  Diagnosis Date  . Asthma   . Hx estrogen therapy 12/30/2012  . Hypercholesteremia     Past Surgical History:  Procedure Laterality Date  . ABDOMINAL HYSTERECTOMY    . BILATERAL SALPINGOOPHORECTOMY      Family History  Problem Relation Age of Onset  . Hyperlipidemia Mother   . Hypertension Father   . Heart disease Father   . Hyperlipidemia Father   . Cancer Paternal Grandmother   . Heart disease Paternal Grandmother   . Cancer Sister 51       died bladder and vaginal cancer   Social History:  reports that she has never smoked. She has never used smokeless tobacco. She reports that she does not drink alcohol or use drugs.  Allergies:  Allergies  Allergen Reactions  . Bee Venom Anaphylaxis, Hives, Swelling and Other (See Comments)    Wasp, yellow jackets, hornets    Medications Prior to Admission  Medication Sig Dispense Refill  . albuterol (PROVENTIL HFA;VENTOLIN HFA) 108 (90 Base) MCG/ACT inhaler Inhale 2 puffs into the lungs every 6 (six) hours as needed for wheezing. 1 Inhaler 0  . atorvastatin (LIPITOR) 20 MG  tablet One po QHS (Patient taking differently: Take 20 mg by mouth at bedtime. ) 30 tablet 5  . cetirizine (ZYRTEC) 10 MG tablet Take 5 mg by mouth daily.    . pantoprazole (PROTONIX) 40 MG tablet TAKE 1 TABLET BY MOUTH ONCE DAILY 90 tablet 1    No results found for this or any previous visit (from the past 48 hour(s)). No results found.  ROS  Blood pressure (!) 152/83, pulse 91, temperature 98.9 F (37.2 C), temperature source Oral, height 5\' 3"  (1.6 m), weight 163 lb (73.9 kg), SpO2 97 %. Physical Exam  Constitutional: She appears well-developed.  HENT:  Mouth/Throat: Oropharynx is clear and moist.  Eyes: Conjunctivae are normal. No scleral icterus.  Neck: No thyromegaly present.  Cardiovascular: Normal rate, regular rhythm and normal heart sounds.  No murmur heard. Respiratory: Effort normal and breath sounds normal.  GI: Soft. She exhibits no distension and no mass. There is no tenderness.  Musculoskeletal: She exhibits no edema.  Neurological: She is alert.  Skin: Skin is warm and dry.     Assessment/Plan Epigastric and chest pain. Ultrasound on negative for cholelithiasis and HIDA scan reveals normal EF. Diagnostic EGD.  Hildred Laser, MD 01/11/2018, 1:31 PM

## 2018-01-12 ENCOUNTER — Encounter: Payer: Self-pay | Admitting: General Surgery

## 2018-01-12 ENCOUNTER — Ambulatory Visit (INDEPENDENT_AMBULATORY_CARE_PROVIDER_SITE_OTHER): Payer: No Typology Code available for payment source | Admitting: General Surgery

## 2018-01-12 VITALS — BP 154/91 | HR 75 | Temp 97.7°F | Resp 18 | Wt 166.0 lb

## 2018-01-12 DIAGNOSIS — D171 Benign lipomatous neoplasm of skin and subcutaneous tissue of trunk: Secondary | ICD-10-CM

## 2018-01-12 NOTE — Progress Notes (Signed)
[facility] History and Physical  Reason for Referral: Lipoma on back Referring Physician:  Dr. Retta Mac Schreier is a 52 y.o. female.  HPI: Ms. Jane Baker is a 52 yo who has noticed a bulge on her upper back just right of midline for years. This has been stable in size she believes but has started to cause her some discomfort when she moves her right arm. She says that with excessive activity she notices that her arm/ shoulder are more tired and sore. She denies ever having any swelling or drainage from the area, and does not have any other similar areas on her body.   She is otherwise healthy and takes minimal medications. She thinks the discomfort started about 6 months ago. She had an EGD yesterday for GERD but was told there was no abnormality found. She underwent this due to some recent epigastric pain that she went to the ED for concerned it was cardiac in nature. Cardiac causes were ruled out.   Past Medical History:  Diagnosis Date  . Asthma   . Hx estrogen therapy 12/30/2012  . Hypercholesteremia     Past Surgical History:  Procedure Laterality Date  . ABDOMINAL HYSTERECTOMY    . BILATERAL SALPINGOOPHORECTOMY      Family History  Problem Relation Age of Onset  . Hyperlipidemia Mother   . Hypertension Father   . Heart disease Father   . Hyperlipidemia Father   . Cancer Paternal Grandmother   . Heart disease Paternal Grandmother   . Cancer Sister 44       died bladder and vaginal cancer    Social History   Tobacco Use  . Smoking status: Never Smoker  . Smokeless tobacco: Never Used  Substance Use Topics  . Alcohol use: No  . Drug use: No    Medications: I have reviewed the patient's current medications. Allergies as of 01/12/2018      Reactions   Bee Venom Anaphylaxis, Hives, Swelling, Other (See Comments)   Wasp, yellow jackets, hornets      Medication List        Accurate as of 01/12/18  2:30 PM. Always use your most recent med  list.          albuterol 108 (90 Base) MCG/ACT inhaler Commonly known as:  PROVENTIL HFA;VENTOLIN HFA Inhale 2 puffs into the lungs every 6 (six) hours as needed for wheezing.   atorvastatin 20 MG tablet Commonly known as:  LIPITOR One po QHS   cetirizine 10 MG tablet Commonly known as:  ZYRTEC Take 5 mg by mouth daily.   famotidine 20 MG tablet Commonly known as:  PEPCID Take 1 tablet (20 mg total) by mouth at bedtime.   pantoprazole 40 MG tablet Commonly known as:  PROTONIX TAKE 1 TABLET BY MOUTH ONCE DAILY        ROS:  A comprehensive review of systems was negative except for: Respiratory: positive for asthma and cough Gastrointestinal: positive for abdominal pain and reflux symptoms Musculoskeletal: positive for back pain  Blood pressure (!) 154/91, pulse 75, temperature 97.7 F (36.5 C), temperature source Temporal, resp. rate 18, weight 166 lb (75.3 kg). Physical Exam  Constitutional: She is oriented to person, place, and time. She appears well-developed and well-nourished.  HENT:  Head: Normocephalic and atraumatic.  Eyes: Pupils are equal, round, and reactive to light. EOM are normal.  Neck: Normal range of motion. Neck supple.  Cardiovascular: Normal rate and regular rhythm.  Pulmonary/Chest:  Effort normal and breath sounds normal.  Upper back, right of midline, 3cm area that is mobile and firm, likely under some trapezius muscle, nontender  Abdominal: Soft. She exhibits no distension. There is no tenderness.  Musculoskeletal: Normal range of motion. She exhibits no edema.  Neurological: She is alert and oriented to person, place, and time.  Skin: Skin is warm and dry.  Psychiatric: She has a normal mood and affect. Her behavior is normal. Thought content normal.  Vitals reviewed.   Results: Troponin 0.03 from 11/15/2017 EKG SR, no ST elevation noted 11/14/2017   Assessment & Plan:  Jane Baker is a 52 y.o. 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 She wants to proceed with removal of this area. She is right handed and uses this hand/ shoulder most.   -OR for excision of lipoma  -No cardiac workup needed as her pain was likely GERD in nature, EKG and troponin in ED negative   All questions were answered to the satisfaction of the patient.  The risk and benefits of lipoma removal were discussed including but not limited to bleeding, infection, risk of recurrence, risk of being under the muscle and requiring more extensive surgery/ deeper surgery.  After careful consideration, Vetta Couzens has decided to proceed.    Virl Cagey 01/12/2018, 2:30 PM

## 2018-01-12 NOTE — Patient Instructions (Signed)
Lipoma A lipoma is a noncancerous (benign) tumor that is made up of fat cells. This is a very common type of soft-tissue growth. Lipomas are usually found under the skin (subcutaneous). They may occur in any tissue of the body that contains fat. Common areas for lipomas to appear include the back, shoulders, buttocks, and thighs. Lipomas grow slowly, and they are usually painless. Most lipomas do not cause problems and do not require treatment. What are the causes? The cause of this condition is not known. What increases the risk? This condition is more likely to develop in:  People who are 40-60 years old.  People who have a family history of lipomas.  What are the signs or symptoms? A lipoma usually appears as a small, round bump under the skin. It may feel soft or rubbery, but the firmness can vary. Most lipomas are not painful. However, a lipoma may become painful if it is located in an area where it pushes on nerves. How is this diagnosed? A lipoma can usually be diagnosed with a physical exam. You may also have tests to confirm the diagnosis and to rule out other conditions. Tests may include:  Imaging tests, such as a CT scan or MRI.  Removal of a tissue sample to be looked at under a microscope (biopsy).  How is this treated? Treatment is not needed for small lipomas that are not causing problems. If a lipoma continues to get bigger or it causes problems, removal is often the best option. Lipomas can also be removed to improve appearance. Removal of a lipoma is usually done with a surgery in which the fatty cells and the surrounding capsule are removed. Most often, a medicine that numbs the area (local anesthetic) is used for this procedure. Follow these instructions at home:  Keep all follow-up visits as directed by your health care provider. This is important. Contact a health care provider if:  Your lipoma becomes larger or hard.  Your lipoma becomes painful, red, or  increasingly swollen. These could be signs of infection or a more serious condition. This information is not intended to replace advice given to you by your health care provider. Make sure you discuss any questions you have with your health care provider. Document Released: 05/23/2002 Document Revised: 11/08/2015 Document Reviewed: 05/29/2014 [employer] Interactive Patient Education  2018 [employer]  

## 2018-01-13 DIAGNOSIS — D171 Benign lipomatous neoplasm of skin and subcutaneous tissue of trunk: Secondary | ICD-10-CM

## 2018-01-13 NOTE — Patient Instructions (Signed)
Jane Baker  [id number]     @PREFPERIOPPHARMACY @   Your procedure is scheduled on  01/20/2018   Report to [facility] at  [place].M.  Call this number if you have problems the morning of surgery:  [phone]   Remember:  Do not eat or drink after midnight.  You may drink clear liquids until  12 midnight 01/19/2018  .  Clear liquids allowed are:                    Water, Juice (non-citric and without pulp), Carbonated beverages, Clear Tea, Black Coffee only, Plain Jell-O only, Gatorade and Plain Popsicles only    Take these medicines the morning of surgery with A SIP OF WATER  Zyrtec, protonix. Use your inhaler before you come.    Do not wear jewelry, make-up or nail polish.  Do not wear lotions, powders, or perfumes, or deodorant.  Do not shave 48 hours prior to surgery.  Men may shave face and neck.  Do not bring valuables to the hospital.  [facility] is not responsible for any belongings or valuables.  Contacts, dentures or bridgework may not be worn into surgery.  Leave your suitcase in the car.  After surgery it may be brought to your room.  For patients admitted to the hospital, discharge time will be determined by your treatment team.  Patients discharged the day of surgery will not be allowed to drive home.   Name and phone number of your driver:   family Special instructions:  None Please read over the following fact sheets that you were given. Anesthesia Post-op Instructions and Care and Recovery After Surgery       Lipoma Removal Lipoma removal is a surgical procedure to remove a noncancerous (benign) tumor that is made up of fat cells (lipoma). Most lipomas are small and painless and do not require treatment. They can form in many areas of the body but are most common under the skin of the back, shoulders, arms, and thighs. You may need lipoma removal if you have a lipoma that is large, growing, or causing discomfort. Lipoma removal may also be done  for cosmetic reasons. Tell a health care provider about:  Any allergies you have.  All medicines you are taking, including vitamins, herbs, eye drops, creams, and over-the-counter medicines.  Any problems you or family members have had with anesthetic medicines.  Any blood disorders you have.  Any surgeries you have had.  Any medical conditions you have.  Whether you are pregnant or may be pregnant. What are the risks? Generally, this is a safe procedure. However, problems may occur, including:  Infection.  Bleeding.  Allergic reactions to medicines.  Damage to nerves or blood vessels near the lipoma.  Scarring.  What happens before the procedure? Staying hydrated Follow instructions from your health care provider about hydration, which may include:  Up to 2 hours before the procedure - you may continue to drink clear liquids, such as water, clear fruit juice, black coffee, and plain tea.  Eating and drinking restrictions Follow instructions from your health care provider about eating and drinking, which may include:  8 hours before the procedure - stop eating heavy meals or foods such as meat, fried foods, or fatty foods.  6 hours before the procedure - stop eating light meals or foods, such as toast or cereal.  6 hours before the procedure - stop drinking  milk or drinks that contain milk.  2 hours before the procedure - stop drinking clear liquids.  Medicines  Ask your health care provider about: ? Changing or stopping your regular medicines. This is especially important if you are taking diabetes medicines or blood thinners. ? Taking medicines such as aspirin and ibuprofen. These medicines can thin your blood. Do not take these medicines before your procedure if your health care provider instructs you not to.  You may be given antibiotic medicine to help prevent infection. General instructions  Ask your health care provider how your surgical site will be  marked or identified.  You will have a physical exam. Your health care provider will check the size of the lipoma and whether it can be moved easily.  You may have imaging tests, such as: ? X-rays. ? CT scan. ? MRI.  Plan to have someone take you home from the hospital or clinic. What happens during the procedure?  To reduce your risk of infection: ? Your health care team will wash or sanitize their hands. ? Your skin will be washed with soap.  You will be given one or more of the following: ? A medicine to help you relax (sedative). ? A medicine to numb the area (local anesthetic). ? A medicine to make you fall asleep (general anesthetic). ? A medicine that is injected into an area of your body to numb everything below the injection site (regional anesthetic).  An incision will be made over the lipoma or very near the lipoma. The incision may be made in a natural skin line or crease.  Tissues, nerves, and blood vessels near the lipoma will be moved out of the way.  The lipoma and the capsule that surrounds it will be separated from the surrounding tissues.  The lipoma will be removed.  The incision may be closed with stitches (sutures).  A bandage (dressing) will be placed over the incision. What happens after the procedure?  Do not drive for 24 hours if you received a sedative.  Your blood pressure, heart rate, breathing rate, and blood oxygen level will be monitored until the medicines you were given have worn off. This information is not intended to replace advice given to you by your health care provider. Make sure you discuss any questions you have with your health care provider. Document Released: 08/16/2015 Document Revised: 11/08/2015 Document Reviewed: 08/16/2015 Elsevier Interactive Patient Education  2018 [place].  Lipoma Removal, Care After Refer to this sheet in the next few weeks. These instructions provide you with information about caring for yourself  after your procedure. Your health care provider may also give you more specific instructions. Your treatment has been planned according to current medical practices, but problems sometimes occur. Call your health care provider if you have any problems or questions after your procedure. What can I expect after the procedure? After the procedure, it is common to have:  Mild pain.  Swelling.  Bruising.  Follow these instructions at home:  Bathing  Do not take baths, swim, or use a hot tub until your health care provider approves. Ask your health care provider if you can take showers. You may only be allowed to take sponge baths for bathing.  Keep your bandage (dressing) dry until your health care provider says it can be removed. Incision care   Follow instructions from your health care provider about how to take care of your incision. Make sure you: ? Wash your hands with soap and  water before you change your bandage (dressing). If soap and water are not available, use hand sanitizer. ? Change your dressing as told by your health care provider. ? Leave stitches (sutures), skin glue, or adhesive strips in place. These skin closures may need to stay in place for 2 weeks or longer. If adhesive strip edges start to loosen and curl up, you may trim the loose edges. Do not remove adhesive strips completely unless your health care provider tells you to do that.  Check your incision area every day for signs of infection. Check for: ? More redness, swelling, or pain. ? Fluid or blood. ? Warmth. ? Pus or a bad smell. Driving  Do not drive or operate heavy machinery while taking prescription pain medicine.  Do not drive for 24 hours if you received a medicine to help you relax (sedative) during your procedure.  Ask your health care provider when it is safe for you to drive. General instructions  Take over-the-counter and prescription medicines only as told by your health care provider.  Do  not use any tobacco products, such as cigarettes, chewing tobacco, and e-cigarettes. These can delay healing. If you need help quitting, ask your health care provider.  Return to your normal activities as told by your health care provider. Ask your health care provider what activities are safe for you.  Keep all follow-up visits as told by your health care provider. This is important. Contact a health care provider if:  You have more redness, swelling, or pain around your incision.  You have fluid or blood coming from your incision.  Your incision feels warm to the touch.  You have pus or a bad smell coming from your incision.  You have pain that does not get better with medicine. Get help right away if:  You have chills or a fever.  You have severe pain. This information is not intended to replace advice given to you by your health care provider. Make sure you discuss any questions you have with your health care provider. Document Released: 08/16/2015 Document Revised: 11/13/2015 Document Reviewed: 08/16/2015 Elsevier Interactive Patient Education  2018 [place] Anesthesia, Adult General anesthesia is the use of medicines to make a person "go to sleep" (be unconscious) for a medical procedure. General anesthesia is often recommended when a procedure:  Is long.  Requires you to be still or in an unusual position.  Is major and can cause you to lose blood.  Is impossible to do without general anesthesia.  The medicines used for general anesthesia are called general anesthetics. In addition to making you sleep, the medicines:  Prevent pain.  Control your blood pressure.  Relax your muscles.  Tell a health care provider about:  Any allergies you have.  All medicines you are taking, including vitamins, herbs, eye drops, creams, and over-the-counter medicines.  Any problems you or family members have had with anesthetic medicines.  Types of anesthetics  you have had in the past.  Any bleeding disorders you have.  Any surgeries you have had.  Any medical conditions you have.  Any history of heart or lung conditions, such as heart failure, sleep apnea, or chronic obstructive pulmonary disease (COPD).  Whether you are pregnant or may be pregnant.  Whether you use tobacco, alcohol, marijuana, or street drugs.  Any history of Armed forces logistics/support/administrative officer.  Any history of depression or anxiety. What are the risks? Generally, this is a safe procedure. However, problems may occur, including:  Allergic reaction to anesthetics.  Lung and heart problems.  Inhaling food or liquids from your stomach into your lungs (aspiration).  Injury to nerves.  Waking up during your procedure and being unable to move (rare).  Extreme agitation or a state of mental confusion (delirium) when you wake up from the anesthetic.  Air in the bloodstream, which can lead to stroke.  These problems are more likely to develop if you are having a major surgery or if you have an advanced medical condition. You can prevent some of these complications by answering all of your health care provider's questions thoroughly and by following all pre-procedure instructions. General anesthesia can cause side effects, including:  Nausea or vomiting  A sore throat from the breathing tube.  Feeling cold or shivery.  Feeling tired, washed out, or achy.  Sleepiness or drowsiness.  Confusion or agitation.  What happens before the procedure? Staying hydrated Follow instructions from your health care provider about hydration, which may include:  Up to 2 hours before the procedure - you may continue to drink clear liquids, such as water, clear fruit juice, black coffee, and plain tea.  Eating and drinking restrictions Follow instructions from your health care provider about eating and drinking, which may include:  8 hours before the procedure - stop eating heavy meals or foods  such as meat, fried foods, or fatty foods.  6 hours before the procedure - stop eating light meals or foods, such as toast or cereal.  6 hours before the procedure - stop drinking milk or drinks that contain milk.  2 hours before the procedure - stop drinking clear liquids.  Medicines  Ask your health care provider about: ? Changing or stopping your regular medicines. This is especially important if you are taking diabetes medicines or blood thinners. ? Taking medicines such as aspirin and ibuprofen. These medicines can thin your blood. Do not take these medicines before your procedure if your health care provider instructs you not to. ? Taking new dietary supplements or medicines. Do not take these during the week before your procedure unless your health care provider approves them.  If you are told to take a medicine or to continue taking a medicine on the day of the procedure, take the medicine with sips of water. General instructions   Ask if you will be going home the same day, the following day, or after a longer hospital stay. ? Plan to have someone take you home. ? Plan to have someone stay with you for the first 24 hours after you leave the hospital or clinic.  For 3-6 weeks before the procedure, try not to use any tobacco products, such as cigarettes, chewing tobacco, and e-cigarettes.  You may brush your teeth on the morning of the procedure, but make sure to spit out the toothpaste. What happens during the procedure?  You will be given anesthetics through a mask and through an IV tube in one of your veins.  You may receive medicine to help you relax (sedative).  As soon as you are asleep, a breathing tube may be used to help you breathe.  An anesthesia specialist will stay with you throughout the procedure. He or she will help keep you comfortable and safe by continuing to give you medicines and adjusting the amount of medicine that you get. He or she will also watch  your blood pressure, pulse, and oxygen levels to make sure that the anesthetics do not cause any problems.  If a  breathing tube was used to help you breathe, it will be removed before you wake up. The procedure may vary among health care providers and hospitals. What happens after the procedure?  You will wake up, often slowly, after the procedure is complete, usually in a recovery area.  Your blood pressure, heart rate, breathing rate, and blood oxygen level will be monitored until the medicines you were given have worn off.  You may be given medicine to help you calm down if you feel anxious or agitated.  If you will be going home the same day, your health care provider may check to make sure you can stand, drink, and urinate.  Your health care providers will treat your pain and side effects before you go home.  Do not drive for 24 hours if you received a sedative.  You may: ? Feel nauseous and vomit. ? Have a sore throat. ? Have mental slowness. ? Feel cold or shivery. ? Feel sleepy. ? Feel tired. ? Feel sore or achy, even in parts of your body where you did not have surgery. This information is not intended to replace advice given to you by your health care provider. Make sure you discuss any questions you have with your health care provider. Document Released: 09/09/2007 Document Revised: 11/13/2015 Document Reviewed: 05/17/2015 Elsevier Interactive Patient Education  2018 [place] Anesthesia, Adult, Care After These instructions provide you with information about caring for yourself after your procedure. Your health care provider may also give you more specific instructions. Your treatment has been planned according to current medical practices, but problems sometimes occur. Call your health care provider if you have any problems or questions after your procedure. What can I expect after the procedure? After the procedure, it is common to have:  Vomiting.  A  sore throat.  Mental slowness.  It is common to feel:  Nauseous.  Cold or shivery.  Sleepy.  Tired.  Sore or achy, even in parts of your body where you did not have surgery.  Follow these instructions at home: For at least 24 hours after the procedure:  Do not: ? Participate in activities where you could fall or become injured. ? Drive. ? Use heavy machinery. ? Drink alcohol. ? Take sleeping pills or medicines that cause drowsiness. ? Make important decisions or sign legal documents. ? Take care of children on your own.  Rest. Eating and drinking  If you vomit, drink water, juice, or soup when you can drink without vomiting.  Drink enough fluid to keep your urine clear or pale yellow.  Make sure you have little or no nausea before eating solid foods.  Follow the diet recommended by your health care provider. General instructions  Have a responsible adult stay with you until you are awake and alert.  Return to your normal activities as told by your health care provider. Ask your health care provider what activities are safe for you.  Take over-the-counter and prescription medicines only as told by your health care provider.  If you smoke, do not smoke without supervision.  Keep all follow-up visits as told by your health care provider. This is important. Contact a health care provider if:  You continue to have nausea or vomiting at home, and medicines are not helpful.  You cannot drink fluids or start eating again.  You cannot urinate after 8-12 hours.  You develop a skin rash.  You have fever.  You have increasing redness at the site of your  procedure. Get help right away if:  You have difficulty breathing.  You have chest pain.  You have unexpected bleeding.  You feel that you are having a life-threatening or urgent problem. This information is not intended to replace advice given to you by your health care provider. Make sure you discuss any  questions you have with your health care provider. Document Released: 09/08/2000 Document Revised: 11/05/2015 Document Reviewed: 05/17/2015 Elsevier Interactive Patient Education  [employer].

## 2018-01-14 ENCOUNTER — Encounter (HOSPITAL_COMMUNITY)
Admission: RE | Admit: 2018-01-14 | Discharge: 2018-01-14 | Disposition: A | Discharge status: Home / Self Care | Payer: 59 | Source: Ambulatory Visit | Attending: General Surgery | Admitting: General Surgery

## 2018-01-14 ENCOUNTER — Other Ambulatory Visit: Payer: Self-pay

## 2018-01-14 ENCOUNTER — Encounter: Payer: Self-pay | Admitting: General Surgery

## 2018-01-14 ENCOUNTER — Encounter (HOSPITAL_COMMUNITY): Payer: Self-pay

## 2018-01-14 DIAGNOSIS — Z01818 Encounter for other preprocedural examination: Secondary | ICD-10-CM | POA: Insufficient documentation

## 2018-01-14 DIAGNOSIS — D171 Benign lipomatous neoplasm of skin and subcutaneous tissue of trunk: Secondary | ICD-10-CM | POA: Insufficient documentation

## 2018-01-14 HISTORY — DX: Gastro-esophageal reflux disease without esophagitis: K21.9

## 2018-01-14 LAB — CBC WITH DIFFERENTIAL/PLATELET
Basophils Absolute: 0 10*3/uL (ref 0.0–0.1)
Basophils Relative: 0 %
Eosinophils Absolute: 0.1 10*3/uL (ref 0.0–0.7)
Eosinophils Relative: 1 %
HEMATOCRIT: 39.9 % (ref 36.0–46.0)
HEMOGLOBIN: 13.5 g/dL (ref 12.0–15.0)
LYMPHS ABS: 2.9 10*3/uL (ref 0.7–4.0)
LYMPHS PCT: 33 %
MCH: 30.2 pg (ref 26.0–34.0)
MCHC: 33.8 g/dL (ref 30.0–36.0)
MCV: 89.3 fL (ref 78.0–100.0)
MONOS PCT: 6 %
Monocytes Absolute: 0.5 10*3/uL (ref 0.1–1.0)
NEUTROS ABS: 5.2 10*3/uL (ref 1.7–7.7)
Neutrophils Relative %: 60 %
Platelets: 280 10*3/uL (ref 150–400)
RBC: 4.47 MIL/uL (ref 3.87–5.11)
RDW: 12.8 % (ref 11.5–15.5)
WBC: 8.7 10*3/uL (ref 4.0–10.5)

## 2018-01-14 LAB — BASIC METABOLIC PANEL
Anion gap: 8 (ref 5–15)
BUN: 14 mg/dL (ref 6–20)
CALCIUM: 9.5 mg/dL (ref 8.9–10.3)
CHLORIDE: 104 mmol/L (ref 98–111)
CO2: 26 mmol/L (ref 22–32)
CREATININE: 0.89 mg/dL (ref 0.44–1.00)
GFR calc Af Amer: >60 mL/min (ref >60)
GFR calc non Af Amer: >60 mL/min (ref >60)
GLUCOSE: 99 mg/dL (ref 70–99)
Potassium: 3.7 mmol/L (ref 3.5–5.1)
Sodium: 138 mmol/L (ref 135–145)

## 2018-01-14 NOTE — H&P (View-Only) (Signed)
[facility] History and Physical  Reason for Referral: Lipoma on back Referring Physician:  Dr. Retta Mac Blase is a 52 y.o. female.  HPI: Ms. Jane Baker is a 52 yo who has noticed a bulge on her upper back just right of midline for years. This has been stable in size she believes but has started to cause her some discomfort when she moves her right arm. She says that with excessive activity she notices that her arm/ shoulder are more tired and sore. She denies ever having any swelling or drainage from the area, and does not have any other similar areas on her body.   She is otherwise healthy and takes minimal medications. She thinks the discomfort started about 6 months ago. She had an EGD yesterday for GERD but was told there was no abnormality found. She underwent this due to some recent epigastric pain that she went to the ED for concerned it was cardiac in nature. Cardiac causes were ruled out.       Past Medical History:  Diagnosis Date  . Asthma   . Hx estrogen therapy 12/30/2012  . Hypercholesteremia          Past Surgical History:  Procedure Laterality Date  . ABDOMINAL HYSTERECTOMY    . BILATERAL SALPINGOOPHORECTOMY           Family History  Problem Relation Age of Onset  . Hyperlipidemia Mother   . Hypertension Father   . Heart disease Father   . Hyperlipidemia Father   . Cancer Paternal Grandmother   . Heart disease Paternal Grandmother   . Cancer Sister 59       died bladder and vaginal cancer    Social History       Tobacco Use  . Smoking status: Never Smoker  . Smokeless tobacco: Never Used  Substance Use Topics  . Alcohol use: No  . Drug use: No    Medications: I have reviewed the patient's current medications.      Allergies as of 01/12/2018      Reactions   Bee Venom Anaphylaxis, Hives, Swelling, Other (See Comments)   Wasp, yellow jackets, hornets               Medication List          Accurate as of 01/12/18  2:30 PM. Always use your most recent med list.           albuterol 108 (90 Base) MCG/ACT inhaler Commonly known as:  PROVENTIL HFA;VENTOLIN HFA Inhale 2 puffs into the lungs every 6 (six) hours as needed for wheezing.   atorvastatin 20 MG tablet Commonly known as:  LIPITOR One po QHS   cetirizine 10 MG tablet Commonly known as:  ZYRTEC Take 5 mg by mouth daily.   famotidine 20 MG tablet Commonly known as:  PEPCID Take 1 tablet (20 mg total) by mouth at bedtime.   pantoprazole 40 MG tablet Commonly known as:  PROTONIX TAKE 1 TABLET BY MOUTH ONCE DAILY        ROS:  A comprehensive review of systems was negative except for: Respiratory: positive for asthma and cough Gastrointestinal: positive for abdominal pain and reflux symptoms Musculoskeletal: positive for back pain  Blood pressure (!) 154/91, pulse 75, temperature 97.7 F (36.5 C), temperature source Temporal, resp. rate 18, weight 166 lb (75.3 kg). Physical Exam  Constitutional: She is oriented to person, place, and time. She appears well-developed and well-nourished.  HENT:  Head:  Normocephalic and atraumatic.  Eyes: Pupils are equal, round, and reactive to light. EOM are normal.  Neck: Normal range of motion. Neck supple.  Cardiovascular: Normal rate and regular rhythm.  Pulmonary/Chest: Effort normal and breath sounds normal.  Upper back, right of midline, 3cm area that is mobile and firm, likely under some trapezius muscle, nontender  Abdominal: Soft. She exhibits no distension. There is no tenderness.  Musculoskeletal: Normal range of motion. She exhibits no edema.  Neurological: She is alert and oriented to person, place, and time.  Skin: Skin is warm and dry.  Psychiatric: She has a normal mood and affect. Her behavior is normal. Thought content normal.  Vitals reviewed.   Results: Troponin 0.03 from 11/15/2017 EKG SR, no ST elevation noted 11/14/2017    Assessment & Plan:  Jane Baker is a 52 y.o. female with a lipoma on her right upper back that has been causing her some pain. She wants to proceed with removal of this area. She is right handed and uses this hand/ shoulder most.   -OR for excision of lipoma  -No cardiac workup needed as her pain was likely GERD in nature, EKG and troponin in ED negative   All questions were answered to the satisfaction of the patient.  The risk and benefits of lipoma removal were discussed including but not limited to bleeding, infection, risk of recurrence, risk of being under the muscle and requiring more extensive surgery/ deeper surgery.  After careful consideration, Shakeita Vandevander has decided to proceed.    Virl Cagey 01/12/2018, 2:30 PM

## 2018-01-14 NOTE — H&P (Signed)
[facility] History and Physical  Reason for Referral: Lipoma on back Referring Physician:  Dr. Retta Mac Jane Baker is a 52 y.o. female.  HPI: Jane Baker is a 52 yo who has noticed a bulge on her upper back just right of midline for years. This has been stable in size she believes but has started to cause her some discomfort when she moves her right arm. She says that with excessive activity she notices that her arm/ shoulder are more tired and sore. She denies ever having any swelling or drainage from the area, and does not have any other similar areas on her body.   She is otherwise healthy and takes minimal medications. She thinks the discomfort started about 6 months ago. She had an EGD yesterday for GERD but was told there was no abnormality found. She underwent this due to some recent epigastric pain that she went to the ED for concerned it was cardiac in nature. Cardiac causes were ruled out.       Past Medical History:  Diagnosis Date  . Asthma   . Hx estrogen therapy 12/30/2012  . Hypercholesteremia          Past Surgical History:  Procedure Laterality Date  . ABDOMINAL HYSTERECTOMY    . BILATERAL SALPINGOOPHORECTOMY           Family History  Problem Relation Age of Onset  . Hyperlipidemia Mother   . Hypertension Father   . Heart disease Father   . Hyperlipidemia Father   . Cancer Paternal Grandmother   . Heart disease Paternal Grandmother   . Cancer Sister 20       died bladder and vaginal cancer    Social History       Tobacco Use  . Smoking status: Never Smoker  . Smokeless tobacco: Never Used  Substance Use Topics  . Alcohol use: No  . Drug use: No    Medications: I have reviewed the patient's current medications.      Allergies as of 01/12/2018      Reactions   Bee Venom Anaphylaxis, Hives, Swelling, Other (See Comments)   Wasp, yellow jackets, hornets               Medication List          Accurate as of 01/12/18  2:30 PM. Always use your most recent med list.           albuterol 108 (90 Base) MCG/ACT inhaler Commonly known as:  PROVENTIL HFA;VENTOLIN HFA Inhale 2 puffs into the lungs every 6 (six) hours as needed for wheezing.   atorvastatin 20 MG tablet Commonly known as:  LIPITOR One po QHS   cetirizine 10 MG tablet Commonly known as:  ZYRTEC Take 5 mg by mouth daily.   famotidine 20 MG tablet Commonly known as:  PEPCID Take 1 tablet (20 mg total) by mouth at bedtime.   pantoprazole 40 MG tablet Commonly known as:  PROTONIX TAKE 1 TABLET BY MOUTH ONCE DAILY        ROS:  A comprehensive review of systems was negative except for: Respiratory: positive for asthma and cough Gastrointestinal: positive for abdominal pain and reflux symptoms Musculoskeletal: positive for back pain  Blood pressure (!) 154/91, pulse 75, temperature 97.7 F (36.5 C), temperature source Temporal, resp. rate 18, weight 166 lb (75.3 kg). Physical Exam  Constitutional: She is oriented to person, place, and time. She appears well-developed and well-nourished.  HENT:  Head:  Normocephalic and atraumatic.  Eyes: Pupils are equal, round, and reactive to light. EOM are normal.  Neck: Normal range of motion. Neck supple.  Cardiovascular: Normal rate and regular rhythm.  Pulmonary/Chest: Effort normal and breath sounds normal.  Upper back, right of midline, 3cm area that is mobile and firm, likely under some trapezius muscle, nontender  Abdominal: Soft. She exhibits no distension. There is no tenderness.  Musculoskeletal: Normal range of motion. She exhibits no edema.  Neurological: She is alert and oriented to person, place, and time.  Skin: Skin is warm and dry.  Psychiatric: She has a normal mood and affect. Her behavior is normal. Thought content normal.  Vitals reviewed.   Results: Troponin 0.03 from 11/15/2017 EKG SR, no ST elevation noted 11/14/2017    Assessment & Plan:  Jane Baker is a 52 y.o. female with a lipoma on her right upper back that has been causing her some pain. She wants to proceed with removal of this area. She is right handed and uses this hand/ shoulder most.   -OR for excision of lipoma  -No cardiac workup needed as her pain was likely GERD in nature, EKG and troponin in ED negative   All questions were answered to the satisfaction of the patient.  The risk and benefits of lipoma removal were discussed including but not limited to bleeding, infection, risk of recurrence, risk of being under the muscle and requiring more extensive surgery/ deeper surgery.  After careful consideration, Jane Baker has decided to proceed.    Jane Baker 01/12/2018, 2:30 PM

## 2018-01-15 ENCOUNTER — Encounter (HOSPITAL_COMMUNITY): Payer: Self-pay | Admitting: Internal Medicine

## 2018-01-18 ENCOUNTER — Encounter (HOSPITAL_COMMUNITY): Payer: Self-pay | Admitting: Anesthesiology

## 2018-01-20 ENCOUNTER — Ambulatory Visit: Admit: 2018-01-20 | Payer: 59 | Admitting: General Surgery

## 2018-01-20 SURGERY — EXCISION MASS
Anesthesia: General

## 2018-01-29 ENCOUNTER — Other Ambulatory Visit: Payer: Self-pay

## 2018-01-29 ENCOUNTER — Encounter (HOSPITAL_COMMUNITY): Payer: Self-pay

## 2018-01-29 ENCOUNTER — Encounter (HOSPITAL_COMMUNITY)
Admission: RE | Admit: 2018-01-29 | Discharge: 2018-01-29 | Disposition: A | Discharge status: Home / Self Care | Payer: 59 | Source: Ambulatory Visit | Attending: General Surgery | Admitting: General Surgery

## 2018-02-03 ENCOUNTER — Encounter (HOSPITAL_COMMUNITY)
Admission: RE | Disposition: A | Discharge status: Home / Self Care | Payer: Self-pay | Source: Ambulatory Visit | Attending: General Surgery

## 2018-02-03 ENCOUNTER — Ambulatory Visit (HOSPITAL_COMMUNITY): Payer: 59 | Admitting: Anesthesiology

## 2018-02-03 ENCOUNTER — Ambulatory Visit (HOSPITAL_COMMUNITY)
Admission: RE | Admit: 2018-02-03 | Discharge: 2018-02-03 | Disposition: A | Discharge status: Home / Self Care | Payer: 59 | Source: Ambulatory Visit | Attending: General Surgery | Admitting: General Surgery

## 2018-02-03 DIAGNOSIS — D179 Benign lipomatous neoplasm, unspecified: Secondary | ICD-10-CM | POA: Diagnosis not present

## 2018-02-03 DIAGNOSIS — E7800 Pure hypercholesterolemia, unspecified: Secondary | ICD-10-CM | POA: Diagnosis not present

## 2018-02-03 DIAGNOSIS — Z91030 Bee allergy status: Secondary | ICD-10-CM | POA: Insufficient documentation

## 2018-02-03 DIAGNOSIS — Z8249 Family history of ischemic heart disease and other diseases of the circulatory system: Secondary | ICD-10-CM | POA: Insufficient documentation

## 2018-02-03 DIAGNOSIS — D171 Benign lipomatous neoplasm of skin and subcutaneous tissue of trunk: Secondary | ICD-10-CM | POA: Diagnosis not present

## 2018-02-03 DIAGNOSIS — Z79899 Other long term (current) drug therapy: Secondary | ICD-10-CM | POA: Diagnosis not present

## 2018-02-03 DIAGNOSIS — K219 Gastro-esophageal reflux disease without esophagitis: Secondary | ICD-10-CM | POA: Insufficient documentation

## 2018-02-03 DIAGNOSIS — J45909 Unspecified asthma, uncomplicated: Secondary | ICD-10-CM | POA: Diagnosis not present

## 2018-02-03 HISTORY — PX: MASS EXCISION: SHX2000

## 2018-02-03 SURGERY — EXCISION MASS
Anesthesia: General | Site: Back

## 2018-02-03 MED ORDER — FENTANYL CITRATE (PF) 100 MCG/2ML IJ SOLN
INTRAMUSCULAR | Status: AC
  Filled 2018-02-03: qty 2

## 2018-02-03 MED ORDER — SUGAMMADEX SODIUM 200 MG/2ML IV SOLN
INTRAVENOUS | Status: AC
  Filled 2018-02-03: qty 2

## 2018-02-03 MED ORDER — ROCURONIUM BROMIDE 100 MG/10ML IV SOLN
INTRAVENOUS | Status: DC | PRN
  Administered 2018-02-03: 30 mg via INTRAVENOUS

## 2018-02-03 MED ORDER — ONDANSETRON HCL 4 MG/2ML IJ SOLN
INTRAMUSCULAR | Status: AC
  Filled 2018-02-03: qty 2

## 2018-02-03 MED ORDER — BUPIVACAINE HCL (PF) 0.5 % IJ SOLN
INTRAMUSCULAR | Status: DC | PRN
  Administered 2018-02-03: 18 mL

## 2018-02-03 MED ORDER — MEPERIDINE HCL 50 MG/ML IJ SOLN: 6.2500 mg | INTRAMUSCULAR | Status: DC | PRN

## 2018-02-03 MED ORDER — CHLORHEXIDINE GLUCONATE CLOTH 2 % EX PADS: 6.0000 | MEDICATED_PAD | Freq: Once | CUTANEOUS | Status: DC

## 2018-02-03 MED ORDER — BUPIVACAINE HCL (PF) 0.5 % IJ SOLN
INTRAMUSCULAR | Status: AC
  Filled 2018-02-03: qty 30

## 2018-02-03 MED ORDER — HYDROMORPHONE HCL 1 MG/ML IJ SOLN: 0.2500 mg | INTRAMUSCULAR | Status: DC | PRN

## 2018-02-03 MED ORDER — 0.9 % SODIUM CHLORIDE (POUR BTL) OPTIME
TOPICAL | Status: DC | PRN
  Administered 2018-02-03: 1000 mL

## 2018-02-03 MED ORDER — ONDANSETRON HCL 4 MG/2ML IJ SOLN
INTRAMUSCULAR | Status: DC | PRN
  Administered 2018-02-03: 4 mg via INTRAVENOUS

## 2018-02-03 MED ORDER — LACTATED RINGERS IV SOLN
INTRAVENOUS | Status: DC
  Administered 2018-02-03: 07:00:00 via INTRAVENOUS

## 2018-02-03 MED ORDER — OXYCODONE HCL 5 MG PO TABS: 5.0000 mg | ORAL_TABLET | ORAL | 0 refills | Status: DC | PRN

## 2018-02-03 MED ORDER — PROPOFOL 10 MG/ML IV BOLUS
INTRAVENOUS | Status: DC | PRN
  Administered 2018-02-03: 150 mg via INTRAVENOUS

## 2018-02-03 MED ORDER — ROCURONIUM BROMIDE 50 MG/5ML IV SOLN
INTRAVENOUS | Status: AC
  Filled 2018-02-03: qty 1

## 2018-02-03 MED ORDER — MIDAZOLAM HCL 2 MG/2ML IJ SOLN
INTRAMUSCULAR | Status: AC
  Filled 2018-02-03: qty 2

## 2018-02-03 MED ORDER — LACTATED RINGERS IV SOLN: INTRAVENOUS | Status: DC

## 2018-02-03 MED ORDER — FENTANYL CITRATE (PF) 100 MCG/2ML IJ SOLN
INTRAMUSCULAR | Status: DC | PRN
  Administered 2018-02-03 (×2): 25 ug via INTRAVENOUS

## 2018-02-03 MED ORDER — PROMETHAZINE HCL 25 MG/ML IJ SOLN: 6.2500 mg | INTRAMUSCULAR | Status: DC | PRN

## 2018-02-03 MED ORDER — CEFAZOLIN SODIUM-DEXTROSE 2-4 GM/100ML-% IV SOLN
INTRAVENOUS | Status: AC
  Filled 2018-02-03: qty 100

## 2018-02-03 MED ORDER — CEFAZOLIN SODIUM-DEXTROSE 2-4 GM/100ML-% IV SOLN
2.0000 g | INTRAVENOUS | Status: AC
  Administered 2018-02-03: 2 g via INTRAVENOUS

## 2018-02-03 MED ORDER — MIDAZOLAM HCL 5 MG/5ML IJ SOLN
INTRAMUSCULAR | Status: DC | PRN
  Administered 2018-02-03: 2 mg via INTRAVENOUS

## 2018-02-03 MED ORDER — DOCUSATE SODIUM 100 MG PO CAPS: 100.0000 mg | ORAL_CAPSULE | Freq: Two times a day (BID) | ORAL | 2 refills | Status: DC

## 2018-02-03 MED ORDER — HYDROCODONE-ACETAMINOPHEN 7.5-325 MG PO TABS: 1.0000 | ORAL_TABLET | Freq: Once | ORAL | Status: DC | PRN

## 2018-02-03 MED ORDER — PROPOFOL 10 MG/ML IV BOLUS
INTRAVENOUS | Status: AC
  Filled 2018-02-03: qty 40

## 2018-02-03 MED ORDER — SUGAMMADEX SODIUM 200 MG/2ML IV SOLN
INTRAVENOUS | Status: DC | PRN
  Administered 2018-02-03: 200 mg via INTRAVENOUS

## 2018-02-03 SURGICAL SUPPLY — 30 items
CHLORAPREP W/TINT 10.5 ML (MISCELLANEOUS) ×3 IMPLANT
CLOTH BEACON ORANGE TIMEOUT ST (SAFETY) ×3 IMPLANT
COVER LIGHT HANDLE STERIS (MISCELLANEOUS) ×6 IMPLANT
DECANTER SPIKE VIAL GLASS SM (MISCELLANEOUS) ×3 IMPLANT
DRSG TEGADERM 4X4.75 (GAUZE/BANDAGES/DRESSINGS) ×3 IMPLANT
ELECT REM PT RETURN 9FT ADLT (ELECTROSURGICAL) ×3
ELECTRODE REM PT RTRN 9FT ADLT (ELECTROSURGICAL) ×1 IMPLANT
GLOVE BIO SURGEON STRL SZ 6.5 (GLOVE) ×2 IMPLANT
GLOVE BIO SURGEONS STRL SZ 6.5 (GLOVE) ×1
GLOVE BIOGEL PI IND STRL 6.5 (GLOVE) ×1 IMPLANT
GLOVE BIOGEL PI IND STRL 7.0 (GLOVE) ×2 IMPLANT
GLOVE BIOGEL PI INDICATOR 6.5 (GLOVE) ×2
GLOVE BIOGEL PI INDICATOR 7.0 (GLOVE) ×4
GOWN STRL REUS W/ TWL XL LVL3 (GOWN DISPOSABLE) ×1 IMPLANT
GOWN STRL REUS W/TWL LRG LVL3 (GOWN DISPOSABLE) ×3 IMPLANT
GOWN STRL REUS W/TWL XL LVL3 (GOWN DISPOSABLE) ×3
KIT TURNOVER KIT A (KITS) ×3 IMPLANT
MANIFOLD NEPTUNE II (INSTRUMENTS) ×3 IMPLANT
NEEDLE HYPO 25X1 1.5 SAFETY (NEEDLE) ×3 IMPLANT
NS IRRIG 1000ML POUR BTL (IV SOLUTION) ×3 IMPLANT
PACK MINOR (CUSTOM PROCEDURE TRAY) ×3 IMPLANT
PAD ARMBOARD 7.5X6 YLW CONV (MISCELLANEOUS) ×3 IMPLANT
SET BASIN LINEN APH (SET/KITS/TRAYS/PACK) ×3 IMPLANT
SPONGE GAUZE 2X2 8PLY STER LF (GAUZE/BANDAGES/DRESSINGS) ×1
SPONGE GAUZE 2X2 8PLY STRL LF (GAUZE/BANDAGES/DRESSINGS) ×2 IMPLANT
SUT PROLENE 2 0 FS (SUTURE) ×6 IMPLANT
SUT VIC AB 3-0 SH 27 (SUTURE) ×2
SUT VIC AB 3-0 SH 27X BRD (SUTURE) ×1 IMPLANT
SYR BULB IRRIGATION 50ML (SYRINGE) ×3 IMPLANT
SYR CONTROL 10ML LL (SYRINGE) ×3 IMPLANT

## 2018-02-03 NOTE — Anesthesia Preprocedure Evaluation (Signed)
Anesthesia Evaluation  Patient identified by MRN, date of birth, ID band Patient awake    Reviewed: Allergy & Precautions, H&P , NPO status , Patient's Chart, lab work & pertinent test results, reviewed documented beta blocker date and time   Airway Mallampati: II  TM Distance: >3 FB Neck ROM: full    Dental no notable dental hx. (+) Teeth Intact, Dental Advidsory Given   Pulmonary neg pulmonary ROS, asthma ,    Pulmonary exam normal breath sounds clear to auscultation       Cardiovascular Exercise Tolerance: Good negative cardio ROS   Rhythm:regular Rate:Normal     Neuro/Psych  Neuromuscular disease negative neurological ROS  negative psych ROS   GI/Hepatic negative GI ROS, Neg liver ROS, GERD  ,  Endo/Other  negative endocrine ROS  Renal/GU Renal diseasenegative Renal ROS  negative genitourinary   Musculoskeletal   Abdominal   Peds  Hematology negative hematology ROS (+)   Anesthesia Other Findings Recent ED visit with c/o CP, dysphagia.  All cardiac labs and indicators were negative Possible prone or lateral positioning.  Reproductive/Obstetrics negative OB ROS                             Anesthesia Physical Anesthesia Plan  ASA: II  Anesthesia Plan: General   Post-op Pain Management:    Induction:   PONV Risk Score and Plan:   Airway Management Planned:   Additional Equipment:   Intra-op Plan:   Post-operative Plan:   Informed Consent: I have reviewed the patients History and Physical, chart, labs and discussed the procedure including the risks, benefits and alternatives for the proposed anesthesia with the patient or authorized representative who has indicated his/her understanding and acceptance.   Dental Advisory Given  Plan Discussed with: CRNA and Anesthesiologist  Anesthesia Plan Comments:         Anesthesia Quick Evaluation

## 2018-02-03 NOTE — Discharge Instructions (Signed)
Discharge Instructions: Shower per your regular routine. You can remove the water proof dressing on 02/05/2018. You can shower with this dressing in place prior to that time.  Once you remove the dressing, you can reapply a dry gauze and paper tape as needed for possible drainage.  Some clear/ pinkish drainage -bloody drainage is possible.  Take tylenol and ibuprofen as needed for pain control, alternating every 4-6 hours.  Take Roxicodone for breakthrough pain. Take colace for constipation related to narcotic pain medication. Do not lift > 10 lbs for 1 week after surgery. DO Stretch the arm and back and keep your shoulder moving/ stretched out.  DO over the head motions and rotations of your arm as you are able to, increasing each day.  The sutures will come out in about 2 weeks given the location on the back and potential for pulling.    Lipoma Removal, Care After Refer to this sheet in the next few weeks. These instructions provide you with information about caring for yourself after your procedure. Your health care provider may also give you more specific instructions. Your treatment has been planned according to current medical practices, but problems sometimes occur. Call your health care provider if you have any problems or questions after your procedure. What can I expect after the procedure? After the procedure, it is common to have:  Mild pain.  Swelling.  Bruising.  Follow these instructions at home:  Bathing  Do not take baths, swim, or use a hot tub until your health care provider approves. You Development worker, community.   Keep your bandage (dressing) dry until your health care provider says it can be removed. Incision care   Follow instructions from your health care provider about how to take care of your incision. Make sure you: ? Wash your hands with soap and water before you change your bandage (dressing). If soap and water are not available, use hand sanitizer. ? Change your  dressing as told by your health care provider. ? Leave stitches (sutures), skin glue, or adhesive strips in place. These skin closures may need to stay in place for 2 weeks or longer. If adhesive strip edges start to loosen and curl up, you may trim the loose edges. Do not remove adhesive strips completely unless your health care provider tells you to do that.  Check your incision area every day for signs of infection. Check for: ? More redness, swelling, or pain. ? Fluid or blood. ? Warmth. ? Pus or a bad smell. Driving  Do not drive or operate heavy machinery while taking prescription pain medicine.  Do not drive for 24 hours if you received a medicine to help you relax (sedative) during your procedure.  Ask your health care provider when it is safe for you to drive. General instructions  Take over-the-counter and prescription medicines only as told by your health care provider.  Do not use any tobacco products, such as cigarettes, chewing tobacco, and e-cigarettes. These can delay healing. If you need help quitting, ask your health care provider.  Return to your normal activities as told by your health care provider. Ask your health care provider what activities are safe for you.  Keep all follow-up visits as told by your health care provider. This is important. Contact a health care provider if:  You have more redness, swelling, or pain around your incision.  You have fluid or blood coming from your incision.  Your incision feels warm to the touch.  You have  pus or a bad smell coming from your incision.  You have pain that does not get better with medicine. Get help right away if:  You have chills or a fever.  You have severe pain. This information is not intended to replace advice given to you by your health care provider. Make sure you discuss any questions you have with your health care provider. Document Released: 08/16/2015 Document Revised: 11/13/2015 Document  Reviewed: 08/16/2015 Elsevier Interactive Patient Education  2018 [employer] Anesthesia, Adult, Care After These instructions provide you with information about caring for yourself after your procedure. Your health care provider may also give you more specific instructions. Your treatment has been planned according to current medical practices, but problems sometimes occur. Call your health care provider if you have any problems or questions after your procedure. What can I expect after the procedure? After the procedure, it is common to have:  Vomiting.  A sore throat.  Mental slowness.  It is common to feel:  Nauseous.  Cold or shivery.  Sleepy.  Tired.  Sore or achy, even in parts of your body where you did not have surgery.  Follow these instructions at home: For at least 24 hours after the procedure:  Do not: ? Participate in activities where you could fall or become injured. ? Drive. ? Use heavy machinery. ? Drink alcohol. ? Take sleeping pills or medicines that cause drowsiness. ? Make important decisions or sign legal documents. ? Take care of children on your own.  Rest. Eating and drinking  If you vomit, drink water, juice, or soup when you can drink without vomiting.  Drink enough fluid to keep your urine clear or pale yellow.  Make sure you have little or no nausea before eating solid foods.  Follow the diet recommended by your health care provider. General instructions  Have a responsible adult stay with you until you are awake and alert.  Return to your normal activities as told by your health care provider. Ask your health care provider what activities are safe for you.  Take over-the-counter and prescription medicines only as told by your health care provider.  If you smoke, do not smoke without supervision.  Keep all follow-up visits as told by your health care provider. This is important. Contact a health care provider  if:  You continue to have nausea or vomiting at home, and medicines are not helpful.  You cannot drink fluids or start eating again.  You cannot urinate after 8-12 hours.  You develop a skin rash.  You have fever.  You have increasing redness at the site of your procedure. Get help right away if:  You have difficulty breathing.  You have chest pain.  You have unexpected bleeding.  You feel that you are having a life-threatening or urgent problem. This information is not intended to replace advice given to you by your health care provider. Make sure you discuss any questions you have with your health care provider. Document Released: 09/08/2000 Document Revised: 11/05/2015 Document Reviewed: 05/17/2015 Elsevier Interactive Patient Education  [employer].

## 2018-02-03 NOTE — Anesthesia Procedure Notes (Signed)
Procedure Name: Intubation Date/Time: 02/03/2018 7:41 AM Performed by: Charmaine Downs, CRNA Pre-anesthesia Checklist: Patient identified, Patient being monitored, Timeout performed, Emergency Drugs available and Suction available Patient Re-evaluated:Patient Re-evaluated prior to induction Oxygen Delivery Method: Circle System Utilized Preoxygenation: Pre-oxygenation with 100% oxygen Induction Type: IV induction Ventilation: Mask ventilation without difficulty Laryngoscope Size: Mac and 4 Grade View: Grade I Tube type: Oral Tube size: 7.0 mm Number of attempts: 1 Airway Equipment and Method: stylet Placement Confirmation: ETT inserted through vocal cords under direct vision,  positive ETCO2 and breath sounds checked- equal and bilateral Secured at: 22 cm Tube secured with: Tape Dental Injury: Teeth and Oropharynx as per pre-operative assessment

## 2018-02-03 NOTE — Anesthesia Postprocedure Evaluation (Signed)
Anesthesia Post Note  Patient: Jane Baker  Procedure(s) Performed: EXCISION LIPOMA 3 CM ON BACK (N/A Back)  Patient location during evaluation: PACU Anesthesia Type: General Level of consciousness: awake and patient cooperative Pain management: pain level controlled Vital Signs Assessment: post-procedure vital signs reviewed and stable Respiratory status: spontaneous breathing, nonlabored ventilation and respiratory function stable Cardiovascular status: blood pressure returned to baseline Postop Assessment: no apparent nausea or vomiting Anesthetic complications: no     Last Vitals:  Vitals:   02/03/18 0710 02/03/18 0902  BP: (!) 148/97 122/81  Pulse:    Resp: 17 14  Temp:  36.6 C  SpO2: 97%     Last Pain:  Vitals:   02/03/18 0902  TempSrc:   PainSc: 0-No pain                 Cobi Aldape J

## 2018-02-03 NOTE — Op Note (Signed)
[facility] Operative Note  02/03/18  Preoperative Diagnosis:  Back Lipoma    Postoperative Diagnosis: Superficial and Deep Back Lipoma ( total measurement 7cm)     Procedure(s) Performed: Excision of Lipomas X 2    Surgeon: Lanell Matar. Constance Haw, MD   Assistants: No qualified resident was available    Anesthesia: General endotracheal   Anesthesiologist: No responsible provider has been recorded for the case.    Specimens:  Lipomas from back X 2    Estimated Blood Loss: Minimal   Blood Replacement: None    Complications: None   Wound Class: Clean    Operative Indications: Ms. Sou is a 52yo healthy patient with a lipoma on her back that has been causing her discomfort with movement of her shoulder on the right. She has had this for years, but says that recently the pain has been getting worse. Due to this, we discussed the risk and benefits of bleeding, infection, recurrence, and potential that this was not a lipoma, and she opted to proceed.   Findings: Superficial lipoma 4cm; Deep lipoma inside of trapezius muscle 3cm    Procedure: The patient was taken to the operating room and placed supine. General endotracheal anesthesia was induced. Intravenous antibiotics were administered per protocol.  She was placed on a partial left side decubitus position with a bump similar to an ERCP position at this hospital in order to gain access to her upper right back. All pressure points were padded. The back was prepped and draped in the normal sterile fashion. A JACHO approved timeout was performed.   The lipoma had been marked and was approximately 3cm under the skin just right of midline. A horizontal incision was made and carried down through the subcutaneous tissue where a globular disc of fatty tissue was excised overlying the trapezius muscle.  This was done with electrocautery dissection, taking care to preserve adequate flaps for closure.  This lipoma measured about  4cm in size. On palpation of the area after removal of this specimen, there was clearly a second deeper lipoma that was inside of the trapezius muscle.  The muscle fibers were spread, and the lipoma was shelled out with blunt dissection with a hemostat and my finger.  This lipoma measured about 3cm in size.   The wound was made hemostatic. Irrigation was performed. Interrupted 3-0 Vicryl sutures were used to approximate the deep layer and 2-0 Prolene interrupted sutures were used to close the skin.  A sterile dressing of gauze and a tegaderm was placed over the incision.   Final inspection revealed acceptable hemostasis. All counts were correct at the end of the case. The patient was awakened from anesthesia and extubate without complication.  The patient went to the PACU in stable condition.   Curlene Labrum, MD [facility], [place] [address] [place],  [id number] [phone] (office)

## 2018-02-03 NOTE — Interval H&P Note (Signed)
History and Physical Interval Note:  3/64/3837 7:93 AM  [place]  has presented today for surgery, with the diagnosis of 3cm lipoma on back  The various methods of treatment have been discussed with the patient and family. After consideration of risks, benefits and other options for treatment, the patient has consented to  Procedure(s): EXCISION LIPOMA 3 CM ON BACK (N/A) as a surgical intervention .  The patient's history has been reviewed, patient examined, no change in status, stable for surgery.  I have reviewed the patient's chart and labs.  Questions were answered to the patient's satisfaction.     Virl Cagey  No additional questions. Area marked.

## 2018-02-03 NOTE — Transfer of Care (Signed)
Immediate Anesthesia Transfer of Care Note  Patient: Jane Baker  Procedure(s) Performed: EXCISION LIPOMA 3 CM ON BACK (N/A Back)  Patient Location: PACU  Anesthesia Type:General  Level of Consciousness: drowsy  Airway & Oxygen Therapy: Patient Spontanous Breathing and Patient connected to nasal cannula oxygen  Post-op Assessment: Report given to RN, Post -op Vital signs reviewed and stable and Patient moving all extremities  Post vital signs: Reviewed and stable  Last Vitals:  Vitals Value Taken Time  BP    Temp    Pulse    Resp    SpO2      Last Pain:  Vitals:   02/03/18 0638  TempSrc: Oral  PainSc: 4       Patients Stated Pain Goal: 4 (59/47/07 6151)  Complications: No apparent anesthesia complications

## 2018-02-04 ENCOUNTER — Encounter (HOSPITAL_COMMUNITY): Payer: Self-pay | Admitting: General Surgery

## 2018-02-16 ENCOUNTER — Ambulatory Visit (INDEPENDENT_AMBULATORY_CARE_PROVIDER_SITE_OTHER): Payer: Self-pay | Admitting: General Surgery

## 2018-02-16 ENCOUNTER — Encounter: Payer: Self-pay | Admitting: General Surgery

## 2018-02-16 VITALS — BP 138/96 | HR 75 | Temp 98.6°F | Resp 18 | Wt 167.0 lb

## 2018-02-16 DIAGNOSIS — Z09 Encounter for follow-up examination after completed treatment for conditions other than malignant neoplasm: Secondary | ICD-10-CM

## 2018-02-16 NOTE — Progress Notes (Signed)
Subjective:     Jane Baker  Status post excision of lipoma, back.  Patient has minimal incisional pain. Objective:    BP (!) 138/96 (BP Location: Left Arm, Patient Position: Sitting, Cuff Size: Normal)   Pulse 75   Temp 98.6 F (37 C) (Temporal)   Resp 18   Wt 167 lb (75.8 kg)   BMI 29.58 kg/m   General:  alert, cooperative and no distress  Back incision healing well.  Sutures removed, Steri-Strips applied.  Small seroma noted, but not tense. Final pathology consistent with diagnosis.     Assessment:    Doing well postoperatively.    Plan:   I told the patient that if the seroma got bigger, she should return to the office for needle aspiration.  She is fine with that.  She does not want needle aspiration at this time.  Follow-up PRN.

## 2018-02-18 LAB — HEPATIC FUNCTION PANEL
ALBUMIN: 4.8 g/dL (ref 3.5–5.5)
ALT: 30 IU/L (ref 0–32)
AST: 27 IU/L (ref 0–40)
Alkaline Phosphatase: 128 IU/L — ABNORMAL HIGH (ref 39–117)
BILIRUBIN, DIRECT: 0.17 mg/dL (ref 0.00–0.40)
Bilirubin Total: 0.8 mg/dL (ref 0.0–1.2)
Total Protein: 7.5 g/dL (ref 6.0–8.5)

## 2018-02-18 LAB — LIPID PANEL
CHOLESTEROL TOTAL: 197 mg/dL (ref 100–199)
Chol/HDL Ratio: 3.8 ratio (ref 0.0–4.4)
HDL: 52 mg/dL (ref >39)
LDL Calculated: 122 mg/dL — ABNORMAL HIGH (ref 0–99)
Triglycerides: 113 mg/dL (ref 0–149)
VLDL Cholesterol Cal: 23 mg/dL (ref 5–40)

## 2018-02-23 ENCOUNTER — Ambulatory Visit (INDEPENDENT_AMBULATORY_CARE_PROVIDER_SITE_OTHER): Payer: 59 | Admitting: Internal Medicine

## 2018-02-28 ENCOUNTER — Encounter: Payer: Self-pay | Admitting: Family Medicine

## 2018-03-01 MED ORDER — SUCRALFATE 1 G PO TABS: ORAL_TABLET | ORAL | 0 refills | Status: DC

## 2018-03-01 MED ORDER — PANTOPRAZOLE SODIUM 40 MG PO TBEC: 40.0000 mg | DELAYED_RELEASE_TABLET | Freq: Every day | ORAL | 0 refills | Status: DC

## 2018-04-06 ENCOUNTER — Encounter: Payer: No Typology Code available for payment source | Admitting: Family Medicine

## 2018-04-26 ENCOUNTER — Encounter: Payer: Self-pay | Admitting: Family Medicine

## 2018-04-26 ENCOUNTER — Ambulatory Visit (INDEPENDENT_AMBULATORY_CARE_PROVIDER_SITE_OTHER): Payer: No Typology Code available for payment source | Admitting: Family Medicine

## 2018-04-26 VITALS — BP 126/84 | Ht 62.5 in | Wt 164.0 lb

## 2018-04-26 DIAGNOSIS — Z23 Encounter for immunization: Secondary | ICD-10-CM | POA: Diagnosis not present

## 2018-04-26 DIAGNOSIS — Z0000 Encounter for general adult medical examination without abnormal findings: Secondary | ICD-10-CM

## 2018-04-26 DIAGNOSIS — R7309 Other abnormal glucose: Secondary | ICD-10-CM

## 2018-04-26 DIAGNOSIS — E7800 Pure hypercholesterolemia, unspecified: Secondary | ICD-10-CM

## 2018-04-26 LAB — POCT GLYCOSYLATED HEMOGLOBIN (HGB A1C): Hemoglobin A1C: 5.1 % (ref 4.0–5.6)

## 2018-04-26 LAB — POCT GLUCOSE (DEVICE FOR HOME USE): GLUCOSE FASTING, POC: 104 mg/dL — AB (ref 70–99)

## 2018-04-26 MED ORDER — ATORVASTATIN CALCIUM 20 MG PO TABS: ORAL_TABLET | ORAL | 5 refills | Status: DC

## 2018-04-26 NOTE — Progress Notes (Signed)
Subjective:    Patient ID: Jane Baker, female    DOB: 1965/11/20, 52 y.o.   MRN: [id number]  HPI The patient comes in today for a wellness visit.  A review of their health history was completed.  A review of medications was also completed.  Any needed refills; lipitor  Eating habits: health conscious, eats lots of vegetables, needs to cut back on sugar. Drinks water and diet coke.   Falls/  MVA accidents in past few months: none  Regular exercise: walk 30 mins 3 times a week  Specialist pt sees on regular basis: none; gets regular vision and dental exams  Preventative health issues were discussed.    Additional concerns: would like flu vaccine.   Results for orders placed or performed in visit on 04/26/18  POCT glycosylated hemoglobin (Hb A1C)  Result Value Ref Range   Hemoglobin A1C 5.1 4.0 - 5.6 %   HbA1c POC (<> result, manual entry)     HbA1c, POC (prediabetic range)     HbA1c, POC (controlled diabetic range)    POCT Glucose (Device for Home Use)  Result Value Ref Range   Glucose Fasting, POC 104 (A) 70 - 99 mg/dL   POC Glucose        Review of Systems  Constitutional: Negative for chills, fatigue, fever and unexpected weight change.  HENT: Negative for congestion, ear pain, sinus pressure, sinus pain and sore throat.   Eyes: Negative for discharge and visual disturbance.  Respiratory: Negative for cough, shortness of breath and wheezing.   Cardiovascular: Negative for chest pain and leg swelling.  Gastrointestinal: Negative for abdominal pain, blood in stool, constipation, diarrhea, nausea and vomiting.  Genitourinary: Negative for difficulty urinating, hematuria, pelvic pain and vaginal discharge.  Neurological: Negative for dizziness, weakness, light-headedness and headaches.  Psychiatric/Behavioral: Negative for dysphoric mood and suicidal ideas.  All other systems reviewed and are negative.      Objective:   Physical Exam  Constitutional: She  is oriented to person, place, and time. She appears well-developed and well-nourished. No distress.  HENT:  Head: Normocephalic and atraumatic.  Right Ear: Tympanic membrane normal.  Left Ear: Tympanic membrane normal.  Nose: Nose normal.  Mouth/Throat: Uvula is midline and oropharynx is clear and moist.  Eyes: Pupils are equal, round, and reactive to light. Conjunctivae and EOM are normal. Right eye exhibits no discharge. Left eye exhibits no discharge.  Neck: Neck supple. No thyromegaly present.  Cardiovascular: Normal rate, regular rhythm and normal heart sounds.  No murmur heard. Pulmonary/Chest: Effort normal and breath sounds normal. No respiratory distress. She has no wheezes. Right breast exhibits no inverted nipple, no mass, no nipple discharge, no skin change and no tenderness. Left breast exhibits no inverted nipple, no mass, no nipple discharge, no skin change and no tenderness.  Abdominal: Soft. Bowel sounds are normal. She exhibits no distension and no mass. There is no tenderness.  Genitourinary: Vagina normal. There is no rash, tenderness or lesion on the right labia. There is no rash, tenderness or lesion on the left labia.  Genitourinary Comments: Hysterectomy and BSO  Musculoskeletal: She exhibits no edema or deformity.  Lymphadenopathy:    She has no cervical adenopathy.  Neurological: She is alert and oriented to person, place, and time. Coordination normal.  Skin: Skin is warm and dry.  Psychiatric: She has a normal mood and affect. Her behavior is normal.  Nursing note and vitals reviewed.         Assessment &  Plan:  1. Routine general medical examination at a health care facility - Plan: POCT Glucose (Device for Home Use) Adult wellness-complete.wellness physical was conducted today. Importance of diet and exercise were discussed in detail.  In addition to this a discussion regarding safety was also covered. We also reviewed over immunizations and gave  recommendations regarding current immunization needed for age.   -flu and tdap today In addition to this additional areas were also touched on including: Preventative health exams needed:  Colonoscopy q 5 years, due in 2021 Mammogram UTD, yearly due in 10/2018 Pap Smear: N/A hysterectomy  Patient was advised yearly wellness exam  2. Elevated glucose level - Plan: POCT glycosylated hemoglobin (Hb A1C) A1c normal today, checked d/t elevated fasting glucose. 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   3. Pure hypercholesterolemia Doing well on current dose of lipitor. No adverse effects. Refills given. Encouraged increased exercise and healthy diet. F/u in 6 months.  Dr. Mickie Hillier was consulted on this case and is in agreement with the above treatment plan.

## 2018-05-24 ENCOUNTER — Encounter: Payer: Self-pay | Admitting: Family Medicine

## 2018-05-24 ENCOUNTER — Ambulatory Visit (INDEPENDENT_AMBULATORY_CARE_PROVIDER_SITE_OTHER): Payer: No Typology Code available for payment source | Admitting: Family Medicine

## 2018-05-24 VITALS — BP 140/88 | Temp 98.2°F | Ht 62.5 in | Wt 168.0 lb

## 2018-05-24 DIAGNOSIS — J329 Chronic sinusitis, unspecified: Secondary | ICD-10-CM

## 2018-05-24 DIAGNOSIS — J310 Chronic rhinitis: Secondary | ICD-10-CM

## 2018-05-24 MED ORDER — CEFDINIR 300 MG PO CAPS: 300.0000 mg | ORAL_CAPSULE | Freq: Two times a day (BID) | ORAL | 0 refills | Status: DC

## 2018-05-24 MED ORDER — EPINEPHRINE 0.3 MG/0.3ML IJ SOAJ: 0.3000 mg | Freq: Once | INTRAMUSCULAR | 1 refills | Status: AC

## 2018-05-24 NOTE — Progress Notes (Signed)
   Subjective:    Patient ID: Jane Baker, female    DOB: Nov 09, 1965, 52 y.o.   MRN: [id number]  Cough  This is a new problem. The current episode started 1 to 4 weeks ago. The cough is non-productive. Associated symptoms include ear pain, a fever, headaches, nasal congestion, rhinorrhea and a sore throat. Treatments tried: Dayquil/Nyquil. The treatment provided mild relief.  pt states she would like refill on Epi-Pen.   Early on pt had dim energy, did not feel well, cong and cough    Whole time   fevr off and on th fpast few days   Ear pain off and      Neck pain too     Non pord cough,,  Mostly clear nasa   disch , yell in the ornings      Review of Systems  Constitutional: Positive for fever.  HENT: Positive for ear pain, rhinorrhea and sore throat.   Respiratory: Positive for cough.   Neurological: Positive for headaches.       Objective:   Physical Exam  Alert, mild malaise. Hydration good Vitals stable. frontal/ maxillary tenderness evident positive nasal congestion. pharynx normal neck supple  lungs clear/no crackles or wheezes. heart regular in rhythm       Assessment & Plan:  Impression rhinosinusitis likely post viral, discussed with patient. plan antibiotics prescribed. Questions answered. Symptomatic care discussed. warning signs discussed. WSL

## 2018-10-09 ENCOUNTER — Other Ambulatory Visit: Payer: Self-pay | Admitting: Family Medicine

## 2018-10-11 MED ORDER — ALBUTEROL SULFATE HFA 108 (90 BASE) MCG/ACT IN AERS: 2.0000 | INHALATION_SPRAY | Freq: Four times a day (QID) | RESPIRATORY_TRACT | 0 refills | Status: DC | PRN

## 2018-12-08 ENCOUNTER — Other Ambulatory Visit: Payer: Self-pay | Admitting: Nurse Practitioner

## 2018-12-08 ENCOUNTER — Encounter: Payer: Self-pay | Admitting: Family Medicine

## 2018-12-08 DIAGNOSIS — R7309 Other abnormal glucose: Secondary | ICD-10-CM

## 2018-12-08 DIAGNOSIS — E7800 Pure hypercholesterolemia, unspecified: Secondary | ICD-10-CM

## 2018-12-11 LAB — COMPREHENSIVE METABOLIC PANEL
ALT: 36 IU/L — ABNORMAL HIGH (ref 0–32)
AST: 34 IU/L (ref 0–40)
Albumin/Globulin Ratio: 1.7 (ref 1.2–2.2)
Albumin: 4.7 g/dL (ref 3.8–4.9)
Alkaline Phosphatase: 113 IU/L (ref 39–117)
BUN/Creatinine Ratio: 16 (ref 9–23)
BUN: 13 mg/dL (ref 6–24)
Bilirubin Total: 0.8 mg/dL (ref 0.0–1.2)
CO2: 22 mmol/L (ref 20–29)
Calcium: 10.2 mg/dL (ref 8.7–10.2)
Chloride: 103 mmol/L (ref 96–106)
Creatinine, Ser: 0.82 mg/dL (ref 0.57–1.00)
GFR calc Af Amer: 93 mL/min/{1.73_m2} (ref >59)
GFR calc non Af Amer: 81 mL/min/{1.73_m2} (ref >59)
Globulin, Total: 2.7 g/dL (ref 1.5–4.5)
Glucose: 101 mg/dL — ABNORMAL HIGH (ref 65–99)
Potassium: 4.4 mmol/L (ref 3.5–5.2)
Sodium: 140 mmol/L (ref 134–144)
Total Protein: 7.4 g/dL (ref 6.0–8.5)

## 2018-12-11 LAB — LIPID PANEL
Chol/HDL Ratio: 3.7 ratio (ref 0.0–4.4)
Cholesterol, Total: 187 mg/dL (ref 100–199)
HDL: 50 mg/dL (ref >39)
LDL Calculated: 111 mg/dL — ABNORMAL HIGH (ref 0–99)
Triglycerides: 128 mg/dL (ref 0–149)
VLDL Cholesterol Cal: 26 mg/dL (ref 5–40)

## 2018-12-15 ENCOUNTER — Other Ambulatory Visit (HOSPITAL_COMMUNITY): Payer: Self-pay | Admitting: Family Medicine

## 2018-12-15 DIAGNOSIS — Z1231 Encounter for screening mammogram for malignant neoplasm of breast: Secondary | ICD-10-CM

## 2018-12-15 MED ORDER — ALBUTEROL SULFATE HFA 108 (90 BASE) MCG/ACT IN AERS: 2.0000 | INHALATION_SPRAY | Freq: Four times a day (QID) | RESPIRATORY_TRACT | 5 refills | Status: DC | PRN

## 2018-12-15 MED ORDER — ATORVASTATIN CALCIUM 20 MG PO TABS: ORAL_TABLET | ORAL | 5 refills | Status: DC

## 2019-02-04 ENCOUNTER — Encounter (HOSPITAL_COMMUNITY): Payer: Self-pay

## 2019-02-04 ENCOUNTER — Other Ambulatory Visit: Payer: Self-pay

## 2019-02-04 ENCOUNTER — Ambulatory Visit (HOSPITAL_COMMUNITY)
Admission: RE | Admit: 2019-02-04 | Discharge: 2019-02-04 | Disposition: A | Discharge status: Home / Self Care | Payer: 59 | Source: Ambulatory Visit | Attending: Family Medicine | Admitting: Family Medicine

## 2019-02-04 DIAGNOSIS — Z1231 Encounter for screening mammogram for malignant neoplasm of breast: Secondary | ICD-10-CM | POA: Diagnosis present

## 2019-05-19 IMAGING — MG DIGITAL SCREENING BILATERAL MAMMOGRAM WITH TOMO AND CAD
9 series · 9 of 25 positions shown · non-contrast
Comparison: Previous exam(s).

CLINICAL DATA: Screening.

EXAM:
DIGITAL SCREENING BILATERAL MAMMOGRAM WITH TOMO AND CAD

[R MLO (1 of 2)]
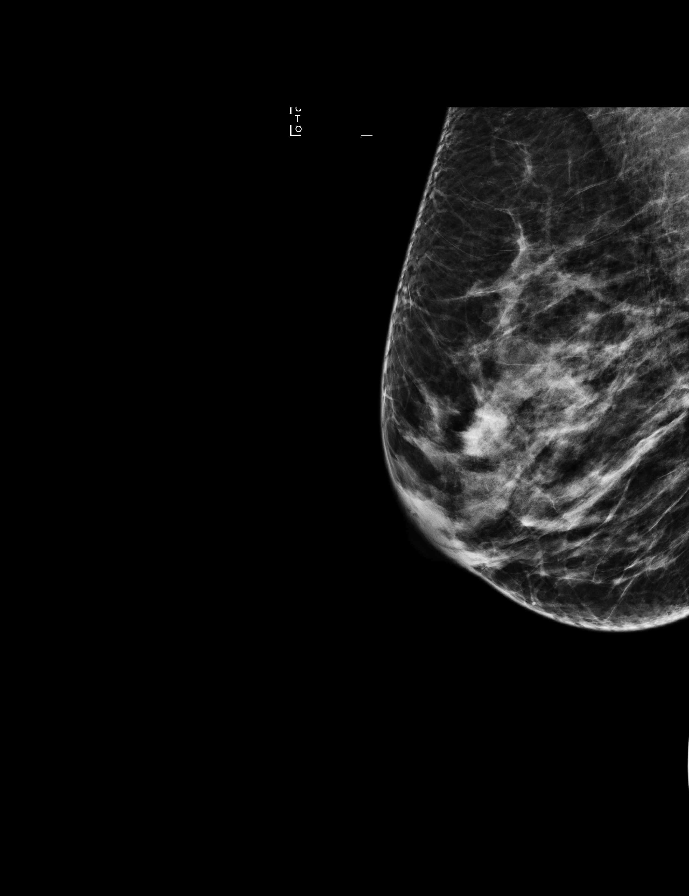

[L CC]
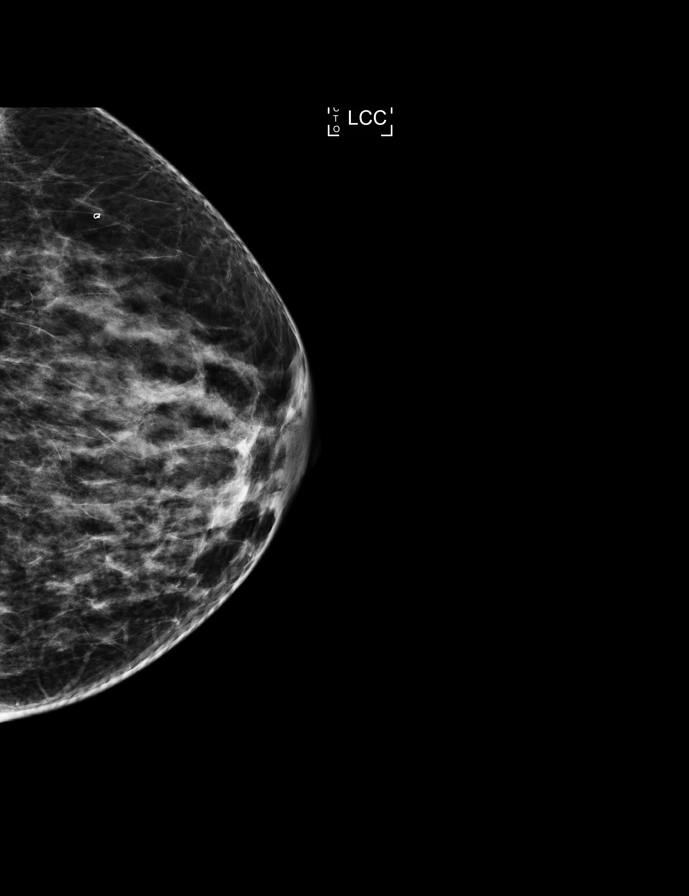

[R MLO (2 of 2)]
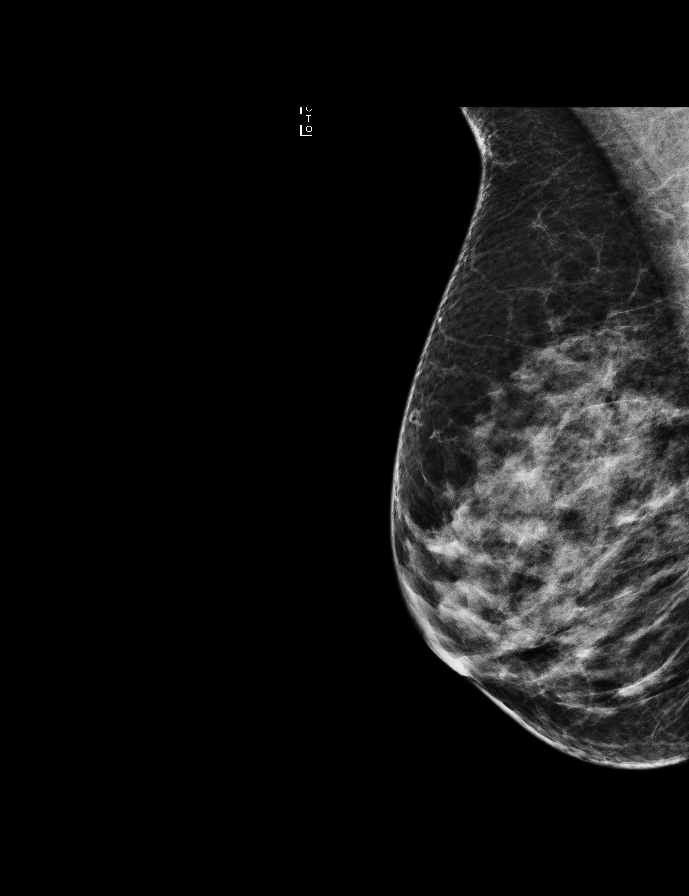

[L MLO]
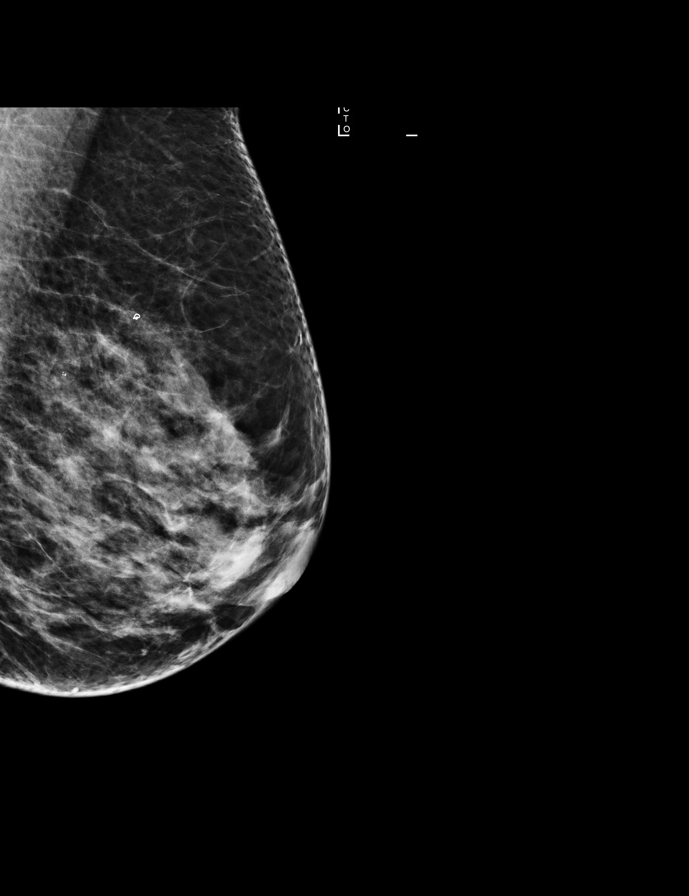

[R CC]
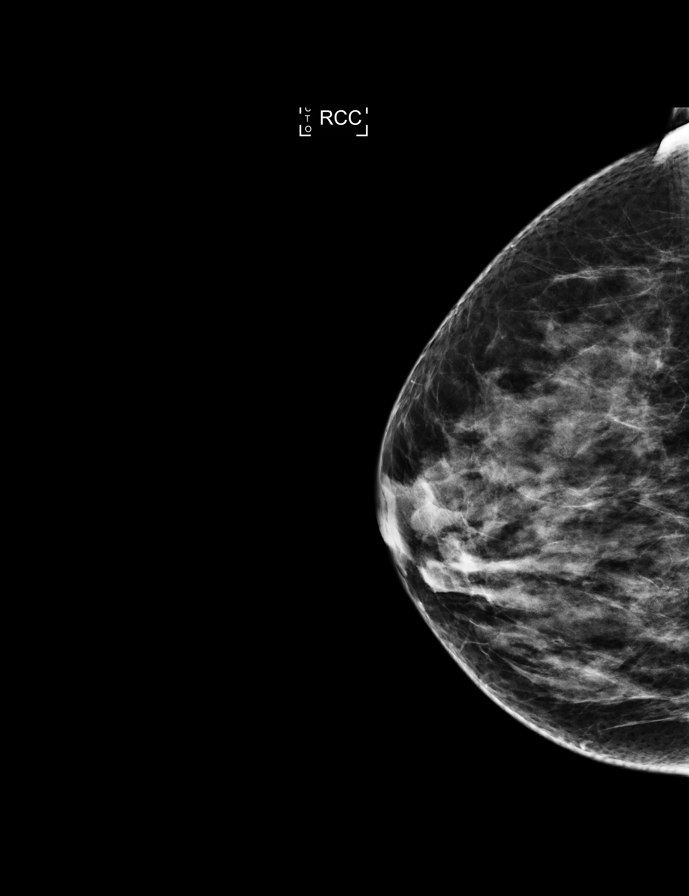

[R MLO tomo · tomo slice 31/61.0]
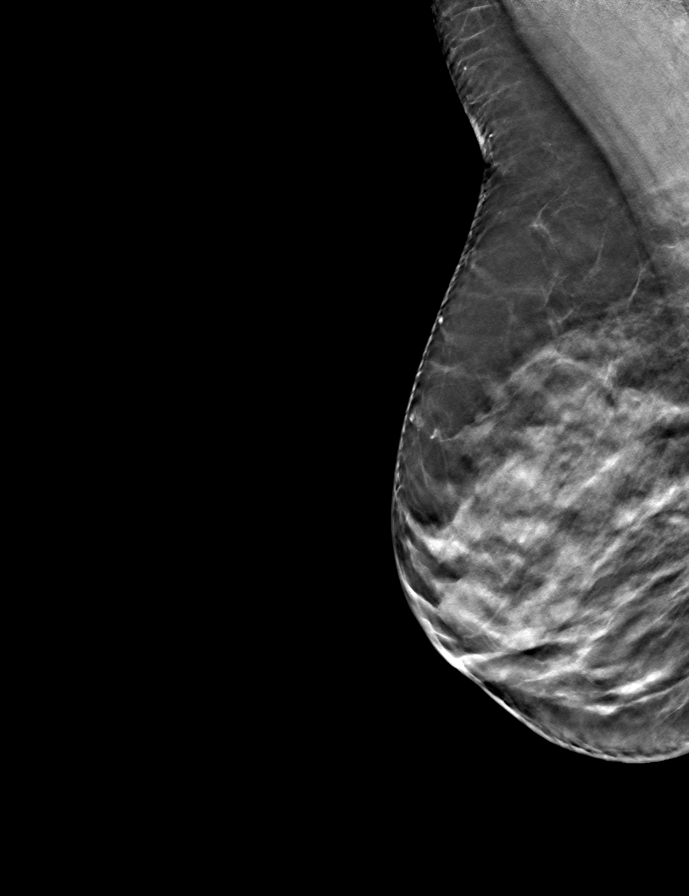

[L MLO tomo · tomo slice 33/65.0]
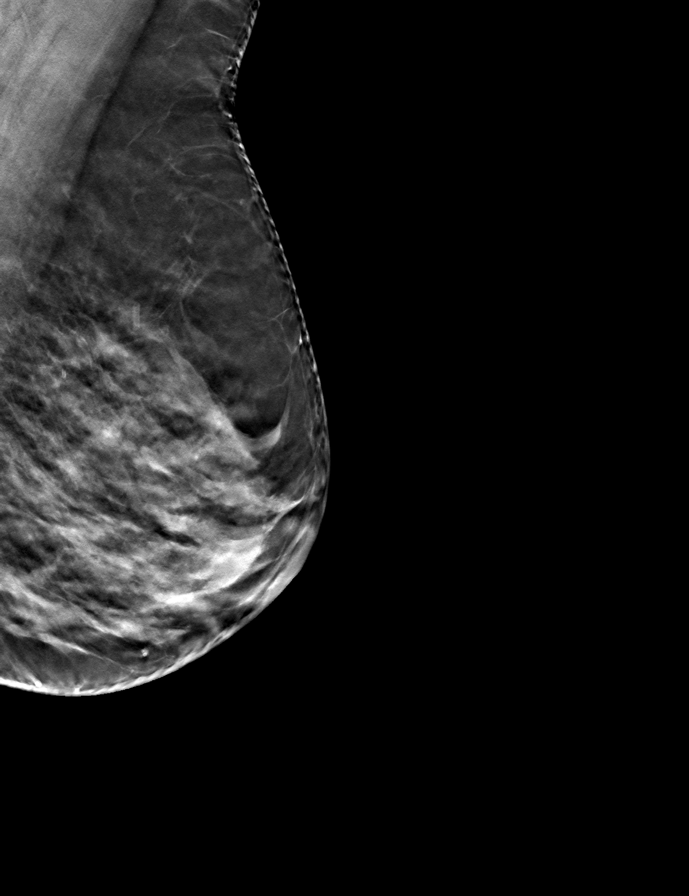

[L CC tomo · tomo slice 32/63.0]
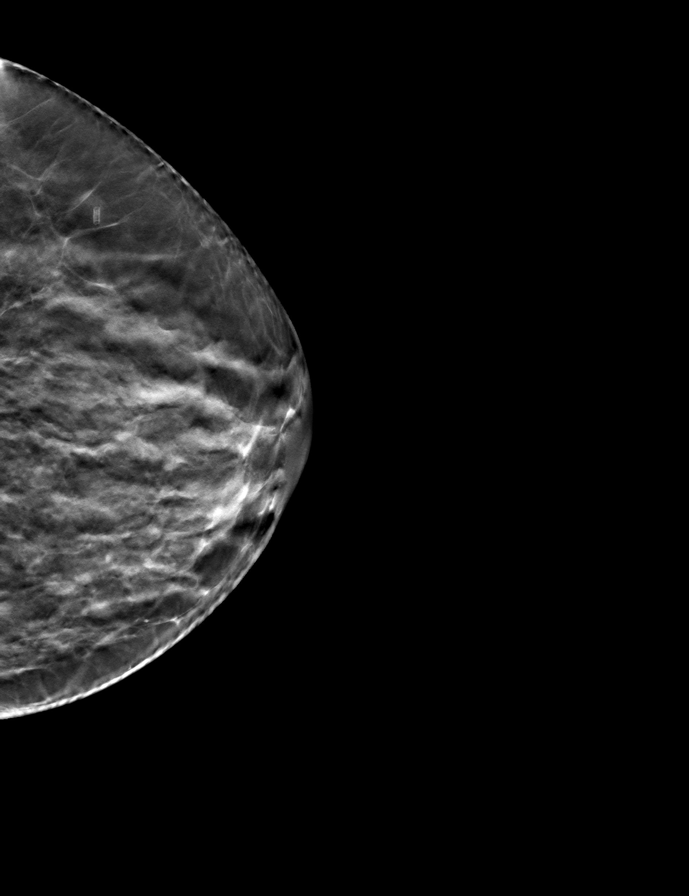

[R CC tomo · tomo slice 32/63.0]
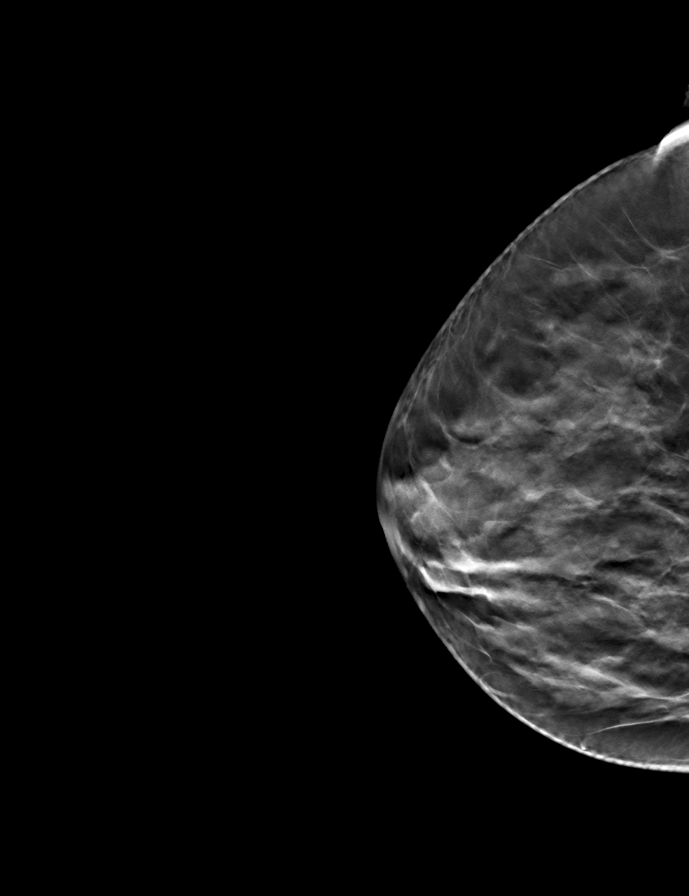

[9 of 25 positions shown; findings below may reference images not displayed]

ACR Breast Density Category c: The breast tissue is heterogeneously
dense, which may obscure small masses.
FINDINGS: There are no findings suspicious for malignancy. Images were
processed with CAD.
IMPRESSION: No mammographic evidence of malignancy. A result letter of this
screening mammogram will be mailed directly to the patient.

RECOMMENDATION:
Screening mammogram in one year. (Code:[id number])

BI-RADS CATEGORY  1: Negative.

## 2019-05-27 IMAGING — DX DG CHEST 2V
2 series · 2 of 2 positions shown · non-contrast
Comparison: 10/31/2014

CLINICAL DATA: Intermittent chest pain x2 weeks with SOB and
dizziness. Patient states that it was originally in the center of
her chest but for 2 days has moved to the left side. Patient states
heaviness in left arm when having chest pain. Hx of asthma.

EXAM:
CHEST - 2 VIEW

[chest pa]
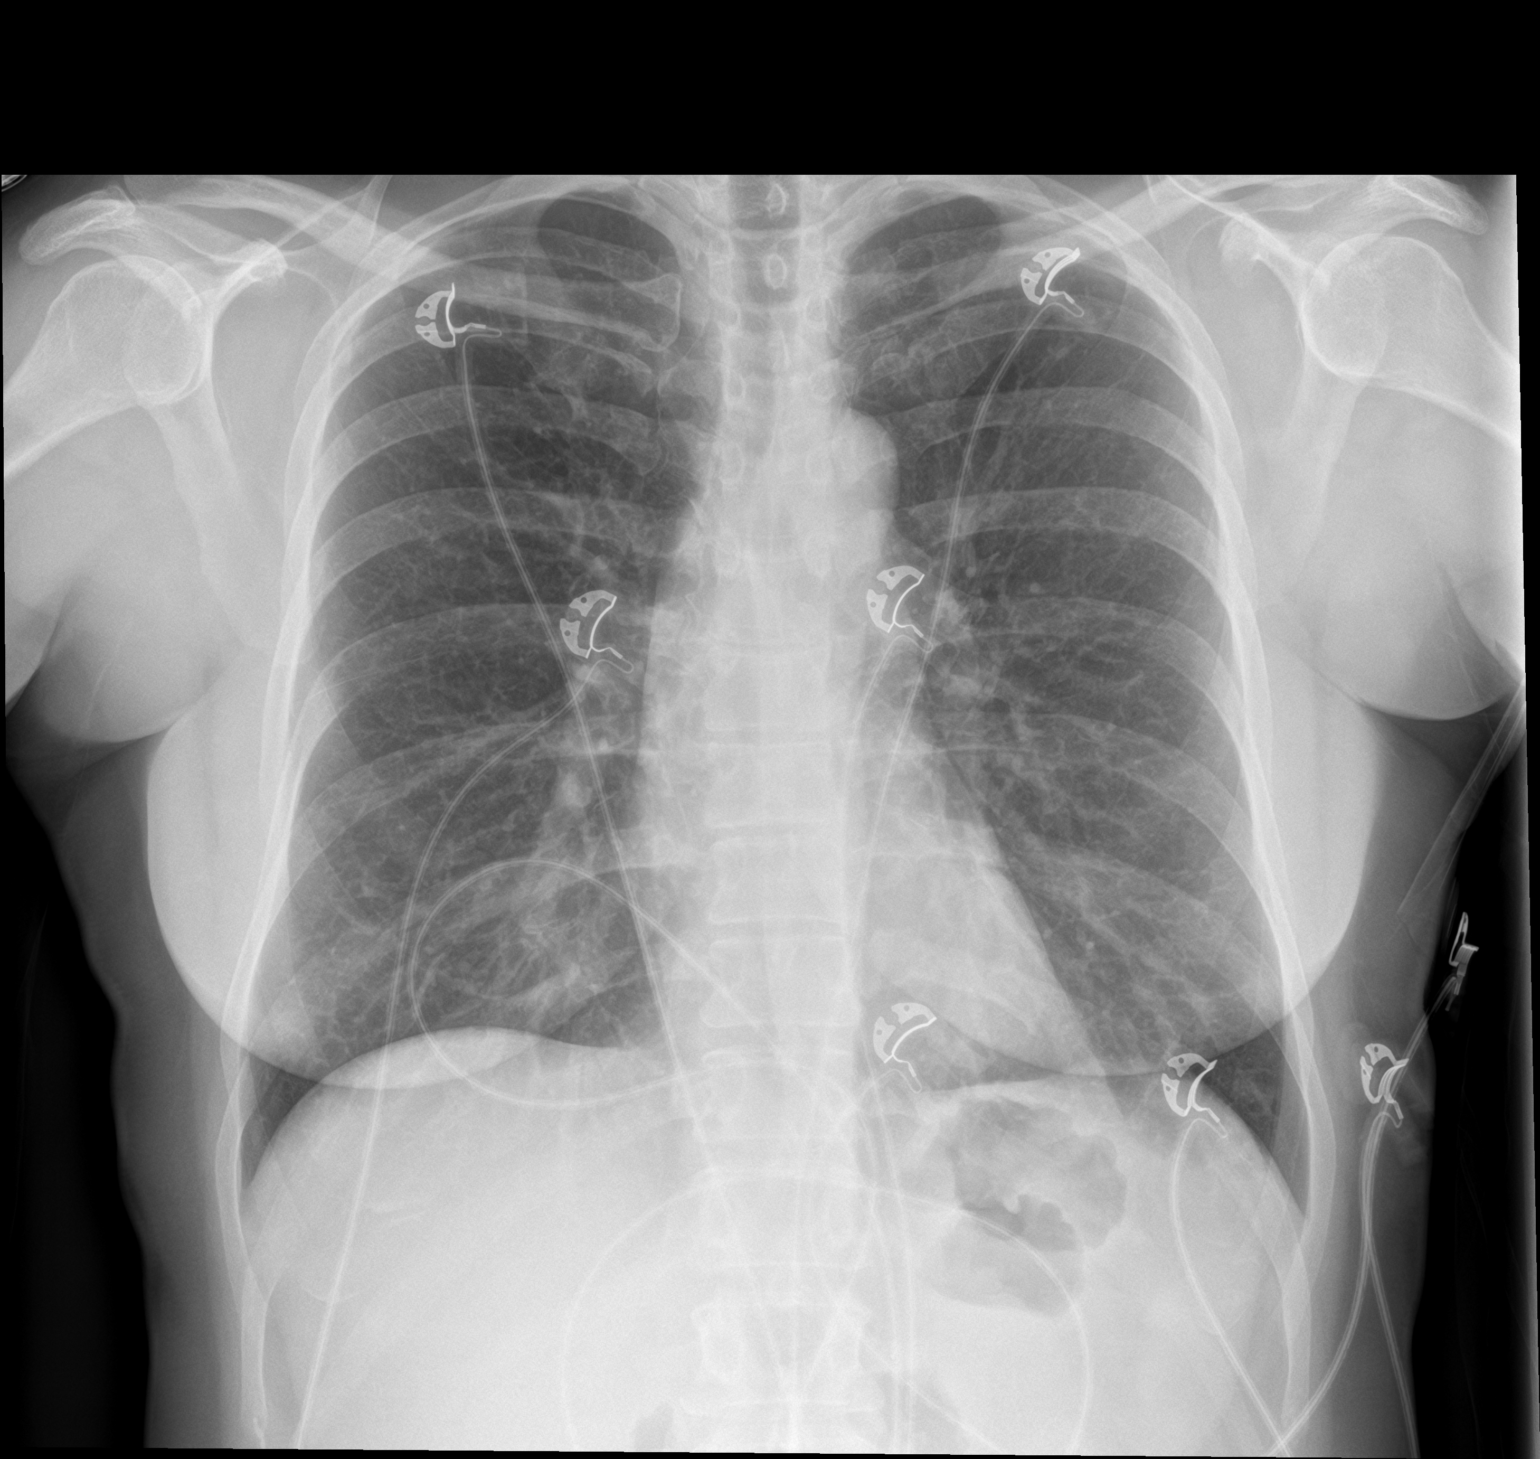

[chest lat]
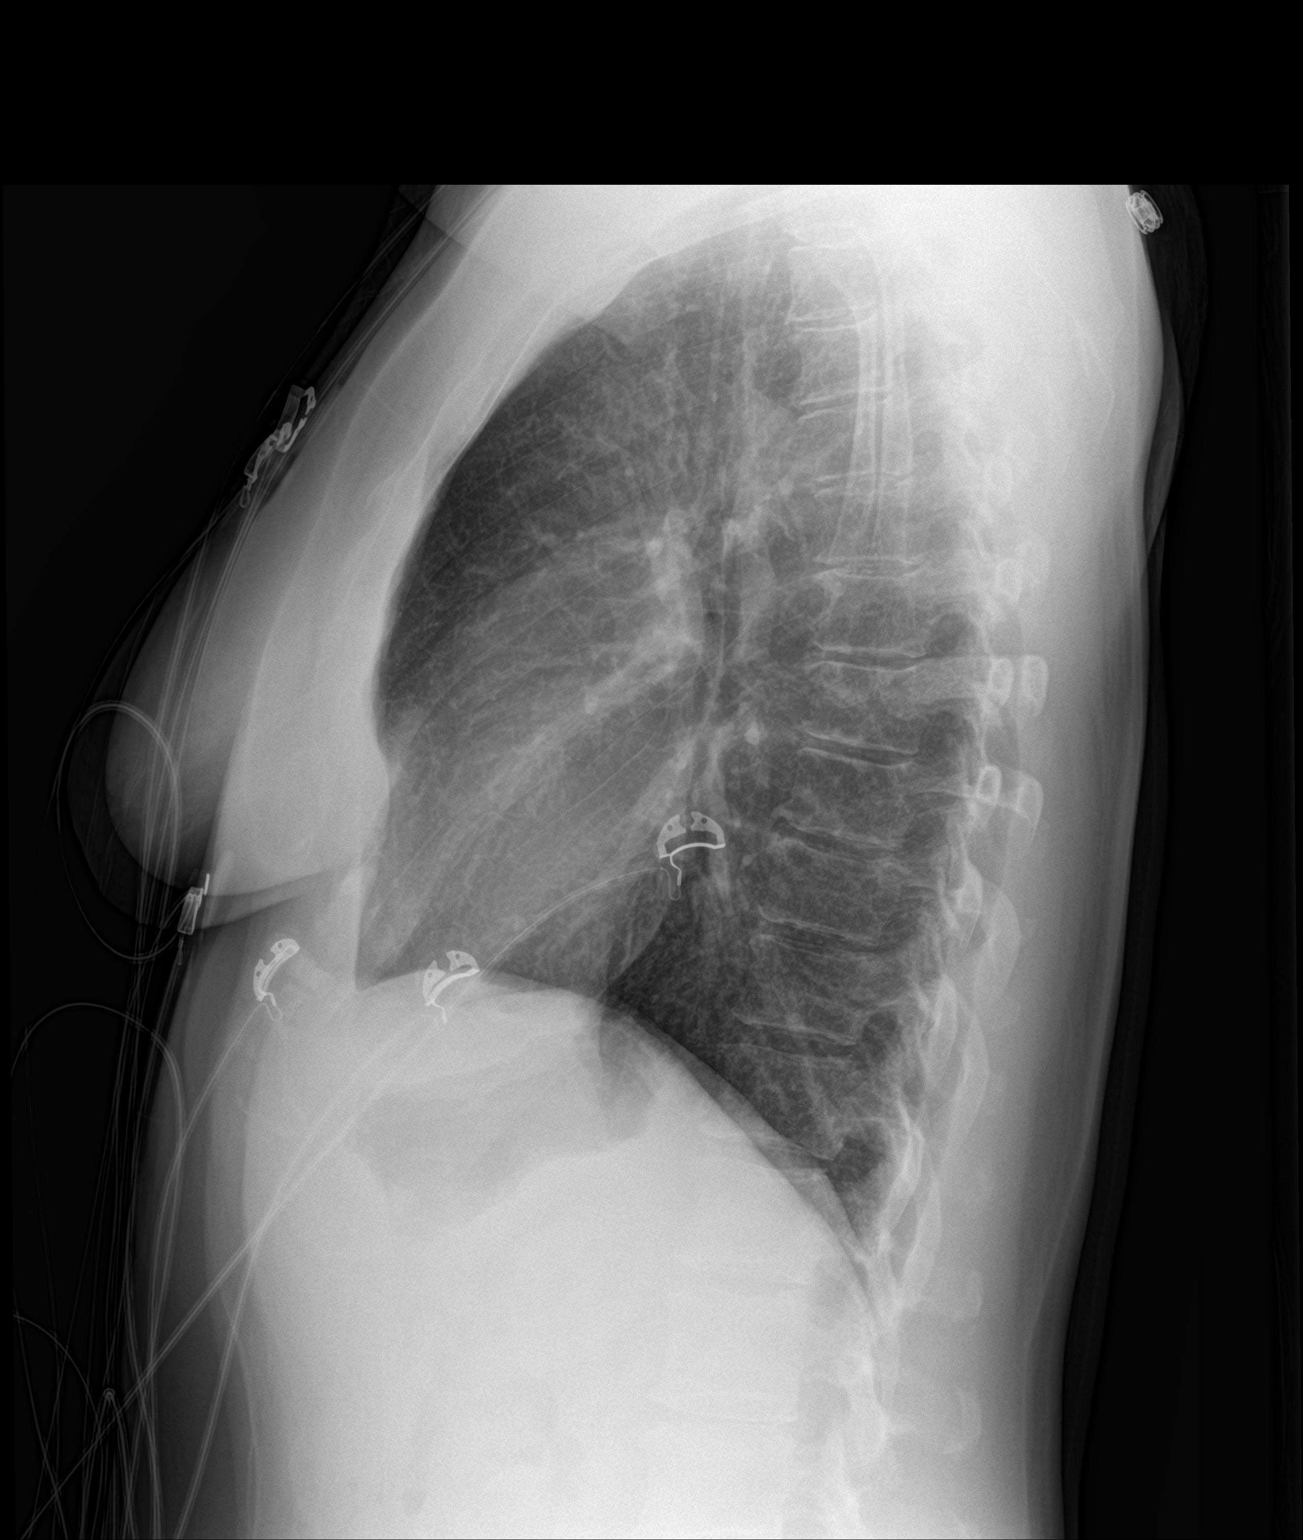

[2 of 2 positions shown; findings below may reference images not displayed]

FINDINGS: Heart, mediastinum and hila are unremarkable.

Mild prominence of the bronchovascular markings, stable. Lungs are
clear.

No pleural effusion or pneumothorax.

Skeletal structures are intact.
IMPRESSION: No active cardiopulmonary disease.

## 2019-06-21 ENCOUNTER — Other Ambulatory Visit: Payer: Self-pay

## 2019-06-21 ENCOUNTER — Ambulatory Visit (INDEPENDENT_AMBULATORY_CARE_PROVIDER_SITE_OTHER): Payer: No Typology Code available for payment source | Admitting: Family Medicine

## 2019-06-21 ENCOUNTER — Other Ambulatory Visit: Payer: Self-pay | Admitting: Family Medicine

## 2019-06-21 DIAGNOSIS — J310 Chronic rhinitis: Secondary | ICD-10-CM

## 2019-06-21 DIAGNOSIS — Z20822 Contact with and (suspected) exposure to covid-19: Secondary | ICD-10-CM

## 2019-06-21 DIAGNOSIS — J329 Chronic sinusitis, unspecified: Secondary | ICD-10-CM | POA: Diagnosis not present

## 2019-06-21 MED ORDER — HYDROCODONE-HOMATROPINE 5-1.5 MG/5ML PO SYRP: ORAL_SOLUTION | ORAL | 0 refills | Status: DC

## 2019-06-21 MED ORDER — AMOXICILLIN 500 MG PO CAPS: 500.0000 mg | ORAL_CAPSULE | Freq: Three times a day (TID) | ORAL | 0 refills | Status: DC

## 2019-06-21 MED ORDER — ALBUTEROL SULFATE HFA 108 (90 BASE) MCG/ACT IN AERS: 2.0000 | INHALATION_SPRAY | Freq: Four times a day (QID) | RESPIRATORY_TRACT | 0 refills | Status: DC | PRN

## 2019-06-21 NOTE — Telephone Encounter (Signed)
Patient's husband called back and said that [facility] is out of the cough medicine and wanted to know if we could change it to [employer] on [address].  They are going to get the other 2 meds at [facility] so those are fine.  I told him to check with [facility] to make sure they have it and call us right back if they don't.  [facility]- [employer]

## 2019-06-21 NOTE — Telephone Encounter (Signed)
Please advise. Thank you

## 2019-06-21 NOTE — Telephone Encounter (Signed)
Medication pended and left detailed message on voicemail (per DPR)

## 2019-06-21 NOTE — Telephone Encounter (Signed)
[facility]

## 2019-06-21 NOTE — Telephone Encounter (Signed)
[facility] out [facility] well. [facility] in 

## 2019-06-21 NOTE — Progress Notes (Signed)
   Subjective:  Audio video  Patient ID: Jane Baker, female    DOB: 08-03-1965, 54 y.o.   MRN: [id number]  Cough This is a new problem. Episode onset: 8 days. Associated symptoms include ear pain, a fever, headaches and a sore throat. Associated symptoms comments: Congestion, body aches. Treatments tried: tylenol.  husband had positive rapid covid test last Wednesday. Pt has not been tested because she has not felt like going anywhere.   Virtual Visit via Telephone Note  I connected with Gitzel Gowans Deshaies on 06/21/19 at 10:30 AM EST by telephone and verified that I am speaking with the correct person using two identifiers.  Location: Patient: home Provider: office   I discussed the limitations, risks, security and privacy concerns of performing an evaluation and management service by telephone and the availability of in person appointments. I also discussed with the patient that there may be a patient responsible charge related to this service. The patient expressed understanding and agreed to proceed.   History of Present Illness:    Observations/Objective:   Assessment and Plan:   Follow Up Instructions:    I discussed the assessment and treatment plan with the patient. The patient was provided an opportunity to ask questions and all were answered. The patient agreed with the plan and demonstrated an understanding of the instructions.   The patient was advised to call back or seek an in-person evaluation if the symptoms worsen or if the condition fails to improve as anticipated.  I provided minutes of non-face-to-face time during this encounter.  Runny nose  Cough  Feeling bad and fever and achey  Chills   Diarrhea signif  Has not have to use inhaler f    Review of Systems  Constitutional: Positive for fever.  HENT: Positive for ear pain and sore throat.   Respiratory: Positive for cough.   Neurological: Positive for headaches.         Objective:   Physical Exam  Virtual      Assessment & Plan:  Impression suspected COVID-19 infection.  Discussed.  Had negative test but classic symptoms and known exposure.  Will cover with antibiotics for potential for secondary bacterial infection.  Appropriate quarantine discussed

## 2019-07-10 ENCOUNTER — Encounter: Payer: Self-pay | Admitting: Family Medicine

## 2019-07-11 MED ORDER — CEFPROZIL 500 MG PO TABS: ORAL_TABLET | ORAL | 0 refills | Status: DC

## 2019-07-15 ENCOUNTER — Encounter: Payer: Self-pay | Admitting: Family Medicine

## 2019-07-20 ENCOUNTER — Encounter: Payer: Self-pay | Admitting: Family Medicine

## 2019-09-12 ENCOUNTER — Encounter: Payer: Self-pay | Admitting: Family Medicine

## 2019-09-12 MED ORDER — ALPRAZOLAM 1 MG PO TABS: ORAL_TABLET | ORAL | 0 refills | Status: DC

## 2019-09-17 ENCOUNTER — Encounter: Payer: Self-pay | Admitting: Family Medicine

## 2019-09-19 ENCOUNTER — Other Ambulatory Visit: Payer: Self-pay | Admitting: *Deleted

## 2019-09-19 MED ORDER — EPINEPHRINE 0.3 MG/0.3ML IJ SOAJ: 0.3000 mg | INTRAMUSCULAR | 0 refills | Status: DC | PRN

## 2019-09-19 NOTE — Telephone Encounter (Signed)
Wellness nov 2019. Epi pen not on med list. Allergic to bees

## 2019-10-01 IMAGING — US US ABDOMEN LIMITED
1 series · 14 of 25 positions shown · non-contrast
Comparison: None.

CLINICAL DATA: Intermittent right upper abdominal pain x3 weeks

EXAM:
ULTRASOUND ABDOMEN LIMITED RIGHT UPPER QUADRANT

[Series 1: us abdomen limited · 0.15mm/px · 14 of 53 slices shown]
[im 1/53]
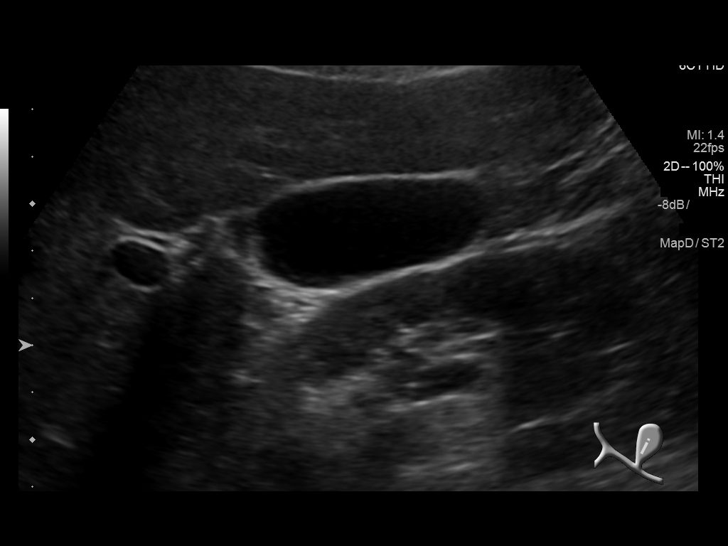
[im 5/53]
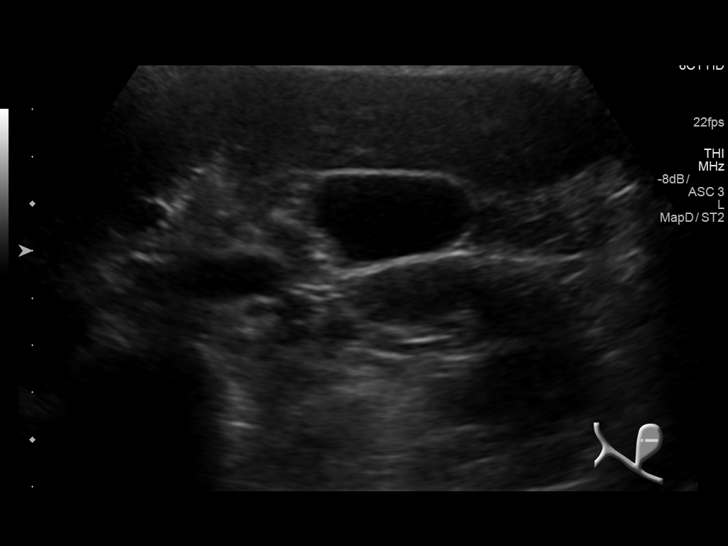
[im 9/53]
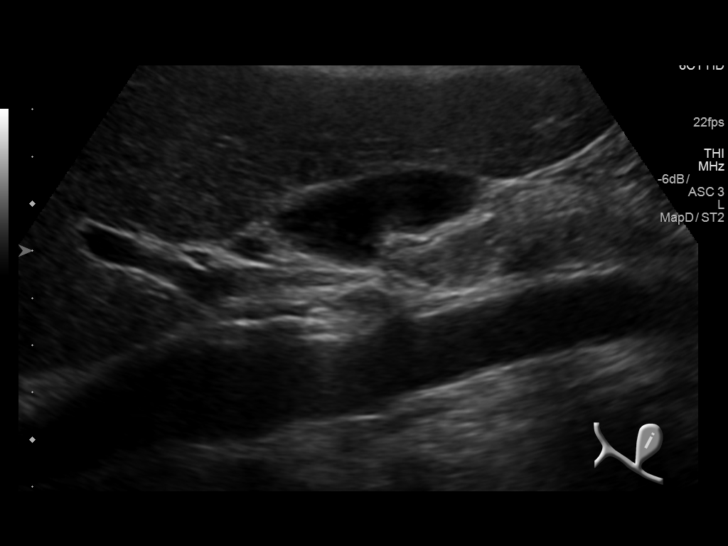
[im 14/53]
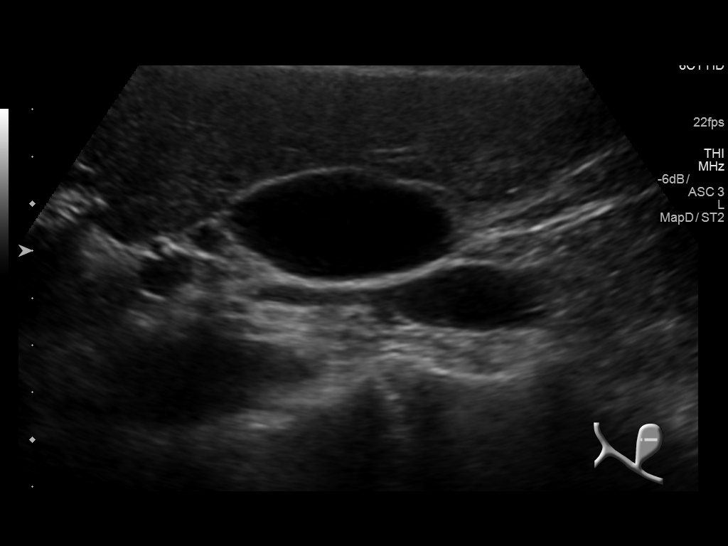
[im 18/53]
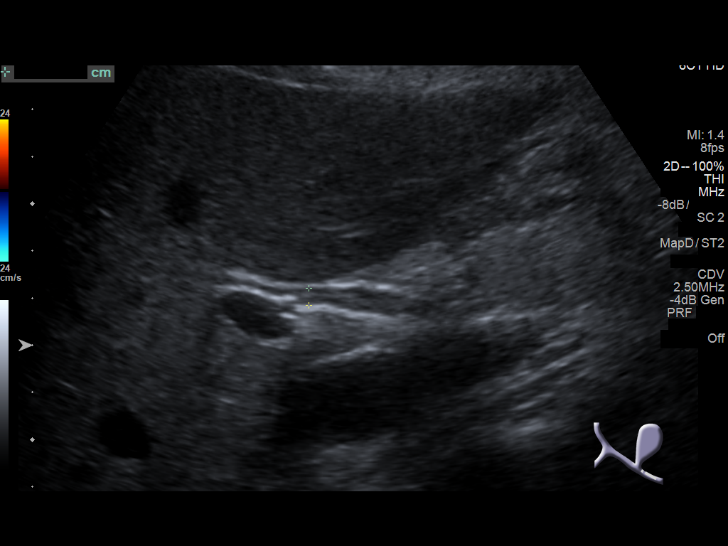
[im 20/53]
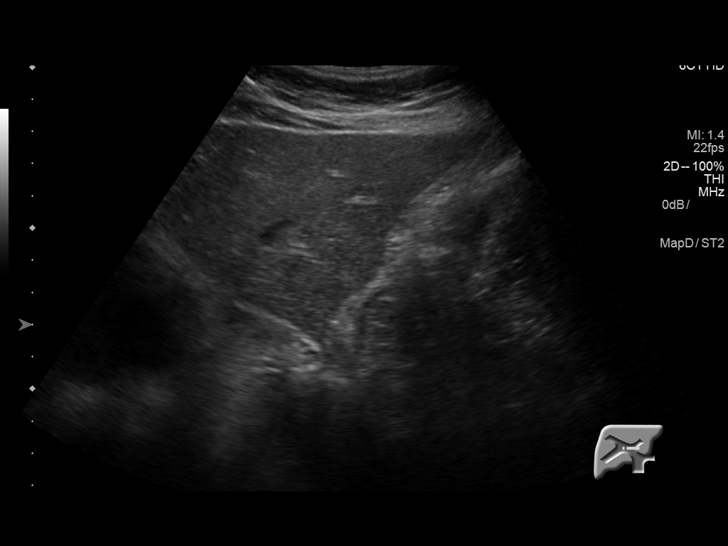
[im 24/53]
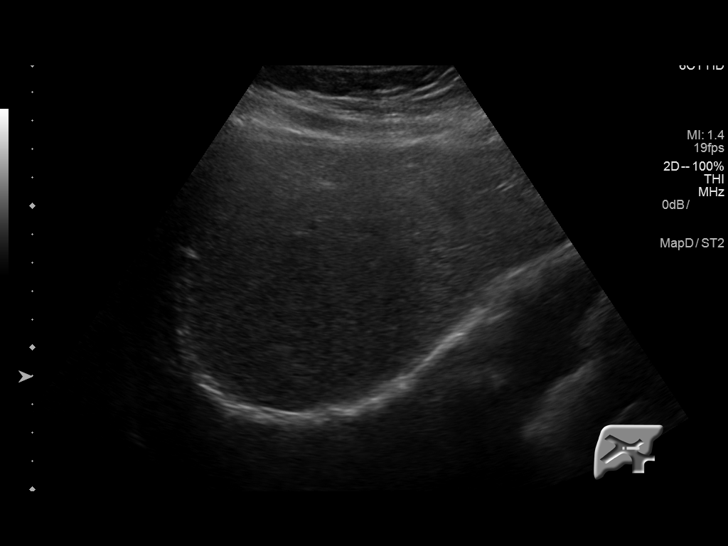
[im 29/53]
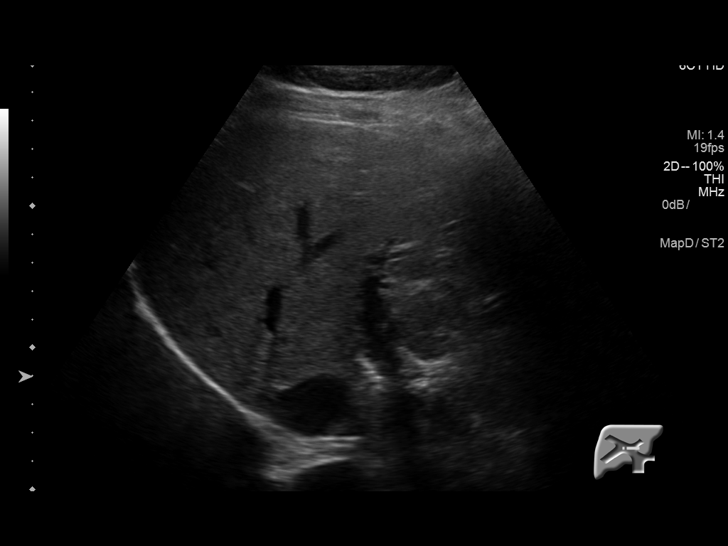
[im 33/53]
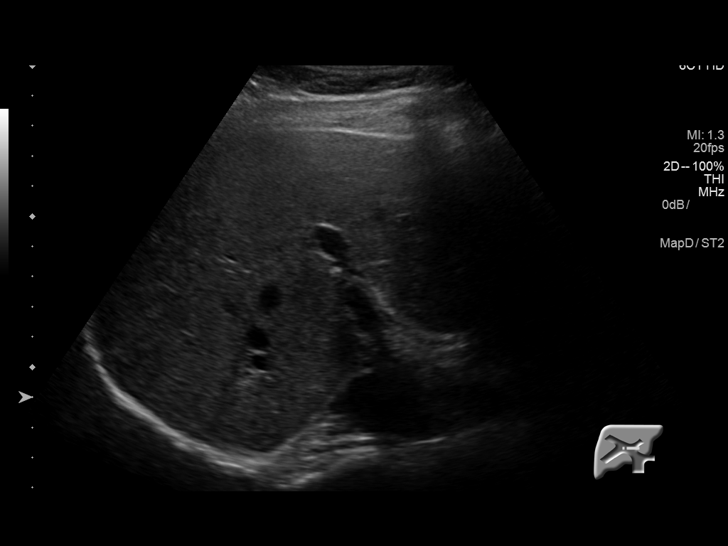
[im 35/53]
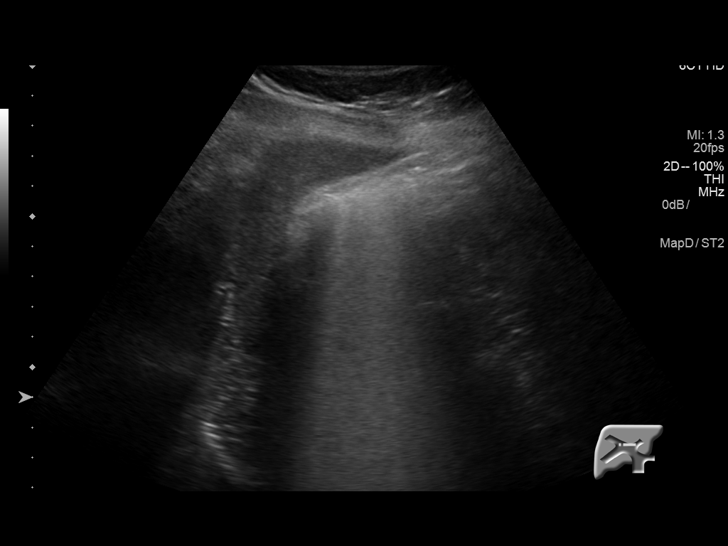
[im 40/53]
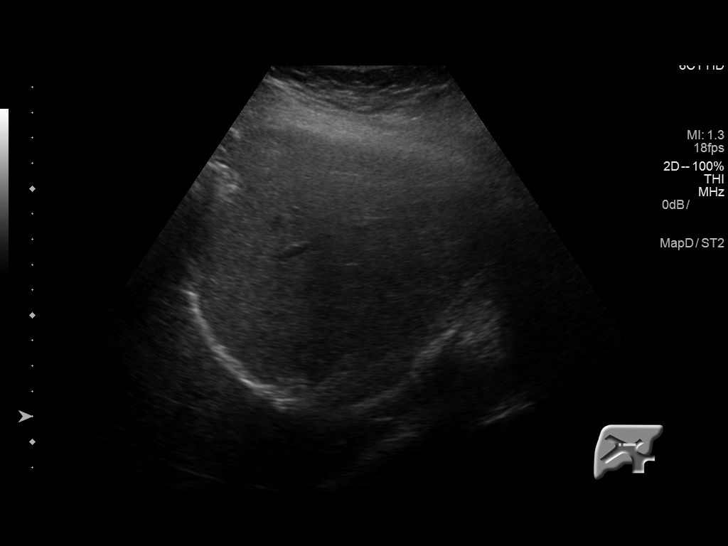
[im 44/53]
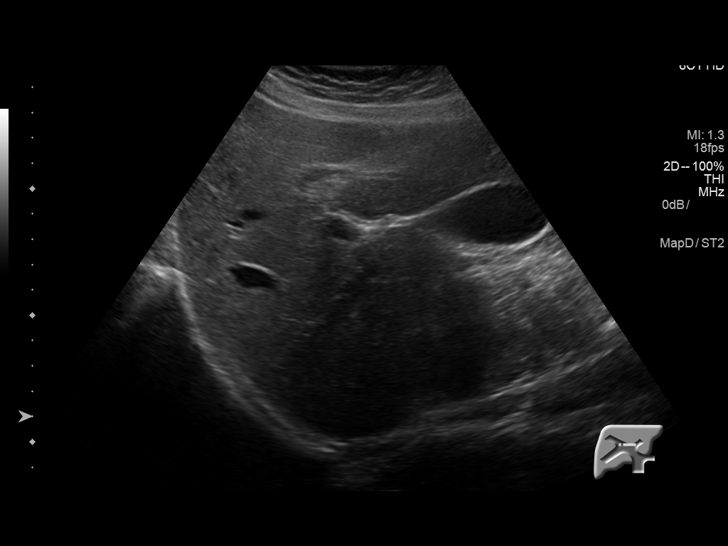
[im 48/53]
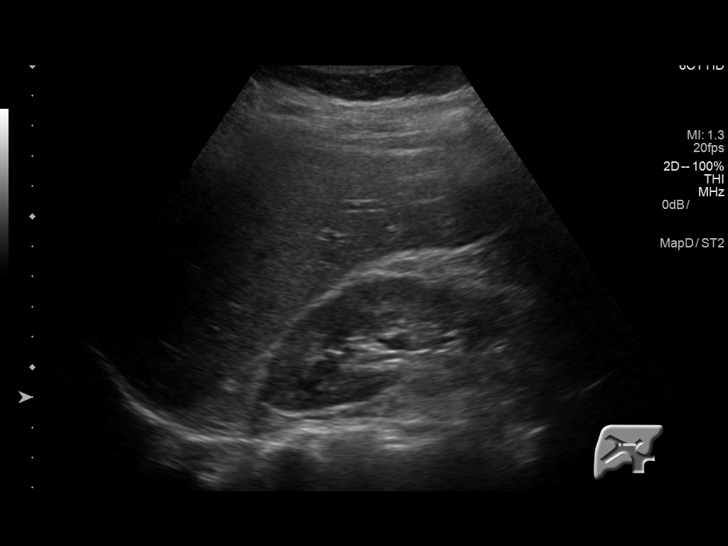
[im 53/53]
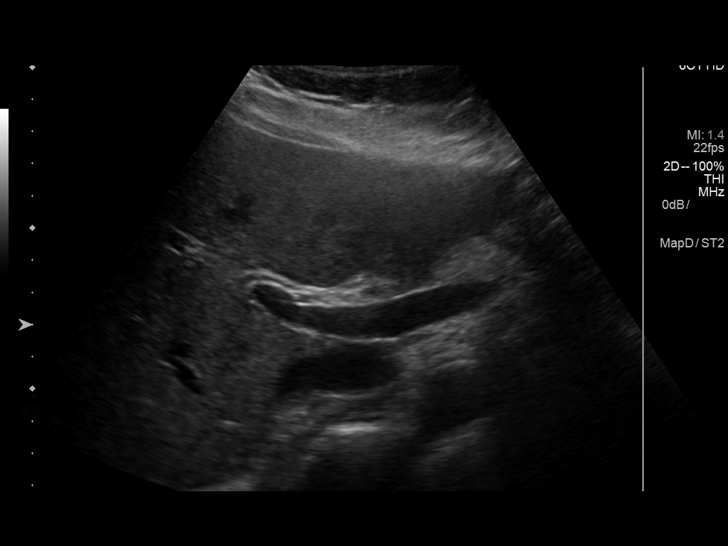

[14 of 25 positions shown; findings below may reference images not displayed]

FINDINGS: Gallbladder:

No gallstones or wall thickening visualized. No sonographic Murphy
sign noted by sonographer.

Common bile duct:

Diameter: 3.6 mm, unremarkable

Liver:

No focal lesion identified. Within normal limits in parenchymal
echogenicity. Portal vein is patent on color Doppler imaging with
normal direction of blood flow towards the liver.
IMPRESSION: Normal

## 2019-12-16 ENCOUNTER — Other Ambulatory Visit: Payer: Self-pay | Admitting: *Deleted

## 2019-12-16 ENCOUNTER — Encounter: Payer: Self-pay | Admitting: Family Medicine

## 2019-12-16 DIAGNOSIS — R7309 Other abnormal glucose: Secondary | ICD-10-CM

## 2019-12-16 DIAGNOSIS — Z79899 Other long term (current) drug therapy: Secondary | ICD-10-CM

## 2019-12-16 DIAGNOSIS — E7800 Pure hypercholesterolemia, unspecified: Secondary | ICD-10-CM

## 2019-12-16 DIAGNOSIS — R5383 Other fatigue: Secondary | ICD-10-CM

## 2019-12-16 DIAGNOSIS — Z0000 Encounter for general adult medical examination without abnormal findings: Secondary | ICD-10-CM

## 2019-12-16 MED ORDER — ATORVASTATIN CALCIUM 20 MG PO TABS: ORAL_TABLET | ORAL | 1 refills | Status: DC

## 2020-03-12 ENCOUNTER — Other Ambulatory Visit (HOSPITAL_COMMUNITY): Payer: Self-pay | Admitting: Family Medicine

## 2020-03-12 DIAGNOSIS — Z1231 Encounter for screening mammogram for malignant neoplasm of breast: Secondary | ICD-10-CM

## 2020-03-28 ENCOUNTER — Telehealth: Payer: Self-pay

## 2020-03-28 NOTE — Telephone Encounter (Signed)
Patient has active labs ordered 12/2019. Patient notified and stated she had forgotten about those labs and will have them completed before her physical.

## 2020-03-28 NOTE — Telephone Encounter (Signed)
Patient has physical 11/5 and needing labs

## 2020-04-09 ENCOUNTER — Ambulatory Visit (HOSPITAL_COMMUNITY)
Admission: RE | Admit: 2020-04-09 | Discharge: 2020-04-09 | Disposition: A | Discharge status: Home / Self Care | Payer: 59 | Source: Ambulatory Visit | Attending: Family Medicine | Admitting: Family Medicine

## 2020-04-09 ENCOUNTER — Other Ambulatory Visit: Payer: Self-pay

## 2020-04-09 DIAGNOSIS — Z1231 Encounter for screening mammogram for malignant neoplasm of breast: Secondary | ICD-10-CM | POA: Insufficient documentation

## 2020-04-10 LAB — HEPATIC FUNCTION PANEL
ALT: 17 IU/L (ref 0–32)
AST: 22 IU/L (ref 0–40)
Albumin: 4.5 g/dL (ref 3.8–4.9)
Alkaline Phosphatase: 98 IU/L (ref 44–121)
Bilirubin Total: 0.6 mg/dL (ref 0.0–1.2)
Bilirubin, Direct: 0.1 mg/dL (ref 0.00–0.40)
Total Protein: 7.3 g/dL (ref 6.0–8.5)

## 2020-04-10 LAB — CBC WITH DIFFERENTIAL/PLATELET
Basophils Absolute: 0 10*3/uL (ref 0.0–0.2)
Basos: 1 %
EOS (ABSOLUTE): 0.1 10*3/uL (ref 0.0–0.4)
Eos: 1 %
Hematocrit: 41.2 % (ref 34.0–46.6)
Hemoglobin: 13.9 g/dL (ref 11.1–15.9)
Immature Grans (Abs): 0 10*3/uL (ref 0.0–0.1)
Immature Granulocytes: 0 %
Lymphocytes Absolute: 2 10*3/uL (ref 0.7–3.1)
Lymphs: 37 %
MCH: 30.3 pg (ref 26.6–33.0)
MCHC: 33.7 g/dL (ref 31.5–35.7)
MCV: 90 fL (ref 79–97)
Monocytes Absolute: 0.5 10*3/uL (ref 0.1–0.9)
Monocytes: 8 %
Neutrophils Absolute: 2.9 10*3/uL (ref 1.4–7.0)
Neutrophils: 53 %
Platelets: 296 10*3/uL (ref 150–450)
RBC: 4.59 x10E6/uL (ref 3.77–5.28)
RDW: 12.2 % (ref 11.7–15.4)
WBC: 5.5 10*3/uL (ref 3.4–10.8)

## 2020-04-10 LAB — LIPID PANEL
Chol/HDL Ratio: 5.1 ratio — ABNORMAL HIGH (ref 0.0–4.4)
Cholesterol, Total: 294 mg/dL — ABNORMAL HIGH (ref 100–199)
HDL: 58 mg/dL (ref >39)
LDL Chol Calc (NIH): 217 mg/dL — ABNORMAL HIGH (ref 0–99)
Triglycerides: 111 mg/dL (ref 0–149)
VLDL Cholesterol Cal: 19 mg/dL (ref 5–40)

## 2020-04-10 LAB — BASIC METABOLIC PANEL
BUN/Creatinine Ratio: 14 (ref 9–23)
BUN: 13 mg/dL (ref 6–24)
CO2: 23 mmol/L (ref 20–29)
Calcium: 9.7 mg/dL (ref 8.7–10.2)
Chloride: 105 mmol/L (ref 96–106)
Creatinine, Ser: 0.92 mg/dL (ref 0.57–1.00)
GFR calc Af Amer: 80 mL/min/{1.73_m2} (ref >59)
GFR calc non Af Amer: 70 mL/min/{1.73_m2} (ref >59)
Glucose: 104 mg/dL — ABNORMAL HIGH (ref 65–99)
Potassium: 4.5 mmol/L (ref 3.5–5.2)
Sodium: 141 mmol/L (ref 134–144)

## 2020-04-10 LAB — HEMOGLOBIN A1C
Est. average glucose Bld gHb Est-mCnc: 111 mg/dL
Hgb A1c MFr Bld: 5.5 % (ref 4.8–5.6)

## 2020-04-20 ENCOUNTER — Encounter: Payer: Self-pay | Admitting: Nurse Practitioner

## 2020-04-20 ENCOUNTER — Other Ambulatory Visit: Payer: Self-pay

## 2020-04-20 ENCOUNTER — Ambulatory Visit (INDEPENDENT_AMBULATORY_CARE_PROVIDER_SITE_OTHER): Payer: No Typology Code available for payment source | Admitting: Nurse Practitioner

## 2020-04-20 VITALS — BP 130/88 | HR 100 | Temp 96.9°F | Wt 155.2 lb

## 2020-04-20 DIAGNOSIS — G479 Sleep disorder, unspecified: Secondary | ICD-10-CM

## 2020-04-20 DIAGNOSIS — Z01419 Encounter for gynecological examination (general) (routine) without abnormal findings: Secondary | ICD-10-CM

## 2020-04-20 DIAGNOSIS — Z0000 Encounter for general adult medical examination without abnormal findings: Secondary | ICD-10-CM

## 2020-04-20 DIAGNOSIS — E7800 Pure hypercholesterolemia, unspecified: Secondary | ICD-10-CM

## 2020-04-20 DIAGNOSIS — Z23 Encounter for immunization: Secondary | ICD-10-CM | POA: Diagnosis not present

## 2020-04-20 DIAGNOSIS — Z1211 Encounter for screening for malignant neoplasm of colon: Secondary | ICD-10-CM

## 2020-04-20 DIAGNOSIS — Z79899 Other long term (current) drug therapy: Secondary | ICD-10-CM | POA: Diagnosis not present

## 2020-04-20 MED ORDER — ATORVASTATIN CALCIUM 20 MG PO TABS: ORAL_TABLET | ORAL | 0 refills | Status: DC

## 2020-04-20 NOTE — Patient Instructions (Addendum)
Benadryl, Tylenol PM, or Melatonin (5-10 mg) for sleep Restart statin for cholesterol for goal of below 160 Repeat lipid panel and LFT in 2 months Colonoscopy-office will schedule this on your behalf Sugar intake in moderation Luvena OTC or Replense for dryness  [place] Jelly

## 2020-04-20 NOTE — Progress Notes (Signed)
Subjective:    Subjective   Patient ID: Jane Baker, female    DOB: 07/21/1965, 54 y.o.   MRN: [id number]  HPI  The patient comes in today for a wellness visit.Has had hysterectomy and bilateral salpingo-oophorectomy. She is married with same sexual partner. Reports fairly healthy diet though does report increases intake of sweets. She is active. Has received her annual vision exam and regular dental care. Using her albuterol inhaler about twice a week. She is due for her colonoscopy. Had Mammogram 03/2020. Denies ETOH, tobacco, vaping or illicit drug use. Has been off Simvastatin for leg cramps though she states her leg cramps persisted after stopping medication.She is retired.Reports increased feelings of stress related to COVID and caring for elderly parents, staying asleep and averages 4-6 hours of sleep per night, and dyspareunia with decreased libido . A review of their health history was completed  A review of medications was also completed.   Eating habits: pretty good   Falls/  MVA accidents in past few months: none  Regular exercise: active most of the time  Specialist pt sees on regular basis: none  Preventative health issues were discussed.   Additional concerns: none  Review of Systems  Review of Systems  Constitutional: Negative for chills, fever and weight loss.  HENT: Negative for congestion, hearing loss, sinus pain, sore throat and tinnitus.   Eyes: Negative for blurred vision.  Respiratory: Positive for wheezing. Negative for cough and shortness of breath.        Intermittent wheezing with asthma  Cardiovascular: Negative for chest pain, palpitations and leg swelling.  Gastrointestinal: Negative for abdominal pain, blood in stool, constipation, diarrhea, heartburn, nausea and vomiting.  Genitourinary: Negative for dysuria, frequency, hematuria and urgency.  Skin: Negative for rash.  Neurological: Negative for dizziness, tingling,  weakness and headaches.  Psychiatric/Behavioral: Negative for depression, substance abuse and suicidal ideas. The patient has insomnia. The patient is not nervous/anxious.     PHQ9 SCORE ONLY 04/20/2020 04/26/2018  PHQ-9 Total Score 4 0    Objective:   Objective   Physical Exam Physical Exam Vitals and nursing note reviewed. Exam conducted with a chaperone present.  Constitutional:      General: She is not in acute distress.    Appearance: Normal appearance. She is normal weight.  Neck:     Thyroid: No thyroid mass, thyromegaly or thyroid tenderness.  Cardiovascular:     Rate and Rhythm: Normal rate and regular rhythm.     Pulses: Normal pulses.     Heart sounds: No murmur heard.  No friction rub. No gallop.   Pulmonary:     Effort: Pulmonary effort is normal. No respiratory distress.     Breath sounds: Normal breath sounds. No wheezing or rhonchi.  Chest:     Breasts:        Right: Normal. No swelling, bleeding, inverted nipple, mass, nipple discharge, skin change or tenderness.        Left: Normal. No swelling, bleeding, inverted nipple, mass, nipple discharge, skin change or tenderness.  Abdominal:     General: Abdomen is flat. There is no distension.     Palpations: Abdomen is soft. There is no mass.     Tenderness: There is no abdominal tenderness.  Genitourinary:    Exam position: Lithotomy position.     Pubic Area: No rash.      Labia:        Right: No rash, tenderness or lesion.  Left: No rash, tenderness or lesion.      Urethra: No urethral pain, urethral swelling or urethral lesion.     Vagina: No vaginal discharge, erythema, tenderness, bleeding or lesions.     Comments: External GU: no rashes or lesions. Vagina: slightly pink tissue; no discharge. Bimanual exam: no tenderness or obvious masses.  Musculoskeletal:     Cervical back: Normal range of motion.     Right lower leg: No edema.     Left lower leg: No edema.  Lymphadenopathy:     Cervical: No  cervical adenopathy.     Upper Body:     Right upper body: No supraclavicular, axillary or pectoral adenopathy.     Left upper body: No supraclavicular, axillary or pectoral adenopathy.  Skin:    General: Skin is warm and dry.     Findings: No lesion or rash.  Neurological:     Mental Status: She is alert and oriented to person, place, and time.  Psychiatric:        Mood and Affect: Mood normal.        Behavior: Behavior normal.        Thought Content: Thought content normal.        Judgment: Judgment normal.    Vitals:   04/20/20 0941 04/20/20 1042  BP: (!) 138/96 130/88  Pulse: 100   Temp: (!) 96.9 F (36.1 C)   SpO2: 98%    Recent Results (from the past 2160 hour(s))  Basic metabolic panel     Status: Abnormal   Collection Time: 04/09/20  8:19 AM  Result Value Ref Range   Glucose 104 (H) 65 - 99 mg/dL   BUN 13 6 - 24 mg/dL   Creatinine, Ser 0.92 0.57 - 1.00 mg/dL   GFR calc non Af Amer 70 >59 mL/min/1.73   GFR calc Af Amer 80 >59 mL/min/1.73    Comment: **In accordance with recommendations from the NKF-ASN Task force,**   [facility] is in the process of updating its eGFR calculation to the   2021 CKD-EPI creatinine equation that estimates kidney function   without a race variable.    BUN/Creatinine Ratio 14 9 - 23   Sodium 141 134 - 144 mmol/L   Potassium 4.5 3.5 - 5.2 mmol/L   Chloride 105 96 - 106 mmol/L   CO2 23 20 - 29 mmol/L   Calcium 9.7 8.7 - 10.2 mg/dL  CBC with Differential/Platelet     Status: None   Collection Time: 04/09/20  8:19 AM  Result Value Ref Range   WBC 5.5 3.4 - 10.8 x10E3/uL   RBC 4.59 3.77 - 5.28 x10E6/uL   Hemoglobin 13.9 11.1 - 15.9 g/dL   Hematocrit 41.2 34.0 - 46.6 %   MCV 90 79 - 97 fL   MCH 30.3 26.6 - 33.0 pg   MCHC 33.7 31 - 35 g/dL   RDW 12.2 11.7 - 15.4 %   Platelets 296 150 - 450 x10E3/uL   Neutrophils 53 Not Estab. %   Lymphs 37 Not Estab. %   Monocytes 8 Not Estab. %   Eos 1 Not Estab. %   Basos 1 Not Estab. %    Neutrophils Absolute 2.9 1.40 - 7.00 x10E3/uL   Lymphocytes Absolute 2.0 0 - 3 x10E3/uL   Monocytes Absolute 0.5 0 - 0 x10E3/uL   EOS (ABSOLUTE) 0.1 0.0 - 0.4 x10E3/uL   Basophils Absolute 0.0 0 - 0 x10E3/uL   Immature Granulocytes 0 Not Estab. %  Immature Grans (Abs) 0.0 0.0 - 0.1 x10E3/uL  Hepatic function panel     Status: None   Collection Time: 04/09/20  8:19 AM  Result Value Ref Range   Total Protein 7.3 6.0 - 8.5 g/dL   Albumin 4.5 3.8 - 4.9 g/dL   Bilirubin Total 0.6 0.0 - 1.2 mg/dL   Bilirubin, Direct 0.10 0.00 - 0.40 mg/dL   Alkaline Phosphatase 98 44 - 121 IU/L    Comment:               **Please note reference interval change**   AST 22 0 - 40 IU/L   ALT 17 0 - 32 IU/L  Lipid panel     Status: Abnormal   Collection Time: 04/09/20  8:19 AM  Result Value Ref Range   Cholesterol, Total 294 (H) 100 - 199 mg/dL   Triglycerides 111 0 - 149 mg/dL   HDL 58 >39 mg/dL   VLDL Cholesterol Cal 19 5 - 40 mg/dL   LDL Chol Calc (NIH) 217 (H) 0 - 99 mg/dL   Chol/HDL Ratio 5.1 (H) 0.0 - 4.4 ratio    Comment:                                   T. Chol/HDL Ratio                                             Men  Women                               1/2 Avg.Risk  3.4    3.3                                   Avg.Risk  5.0    4.4                                2X Avg.Risk  9.6    7.1                                3X Avg.Risk 23.4   11.0   Hemoglobin A1c     Status: None   Collection Time: 04/09/20  8:19 AM  Result Value Ref Range   Hgb A1c MFr Bld 5.5 4.8 - 5.6 %    Comment:          Prediabetes: 5.7 - 6.4          Diabetes: >6.4          Glycemic control for adults with diabetes: <7.0    Est. average glucose Bld gHb Est-mCnc 111 mg/dL   Discussed labs with patient   Assessment & Plan:     Problem List Items Addressed This Visit      Other   Hyperlipidemia   Relevant Medications   atorvastatin (LIPITOR) 20 MG tablet   Other Relevant Orders   Lipid panel    Other Visit  Diagnoses    Well woman exam    -  Primary   High risk medication use  Relevant Orders   Hepatic function panel   Need for vaccination       Relevant Orders   Flu Vaccine QUAD 6+ mos PF IM (Fluarix Quad PF) (Completed)   Sleep disturbance       Screen for colon cancer       Relevant Orders   Ambulatory referral to Gastroenterology     Meds ordered this encounter  Medications  . atorvastatin (LIPITOR) 20 MG tablet    Sig: One po QHS    Dispense:  90 tablet    Refill:  0    Order Specific Question:   Supervising Provider    Answer:   Sallee Lange A [[id number]]   Trial of Benadryl, Tylenol PM, or Melatonin (5-10 mg) for sleep. Call back if no improvement.  Restart statin for cholesterol for goal of below 160 Repeat lipid panel and LFT in 2 months Referral to GI for Colonoscopy Limit sugar intake, continue diet and exercise Educated patient regarding physiological response to stress, defers medication at this time. Luvena or Replense OTC for vaginal dryness  [place] Jelly OTC PRN vaginal dryness with intercourse  Return in 1 year (on 04/20/2021) for physical.

## 2020-04-20 NOTE — Progress Notes (Signed)
   Subjective:    Patient ID: Jane Baker, female    DOB: 08-27-1965, 54 y.o.   MRN: [id number]  HPI  The patient comes in today for a wellness visit.    A review of their health history was completed.  A review of medications was also completed.   Eating habits: pretty good   Falls/  MVA accidents in past few months: none  Regular exercise: active most of the time  Specialist pt sees on regular basis: none  Preventative health issues were discussed.   Additional concerns: none  Review of Systems     Objective:   Physical Exam        Assessment & Plan:

## 2020-04-21 ENCOUNTER — Other Ambulatory Visit: Payer: Self-pay | Admitting: Nurse Practitioner

## 2020-04-24 ENCOUNTER — Encounter (INDEPENDENT_AMBULATORY_CARE_PROVIDER_SITE_OTHER): Payer: Self-pay | Admitting: *Deleted

## 2020-04-30 ENCOUNTER — Encounter: Payer: Self-pay | Admitting: Nurse Practitioner

## 2021-03-04 ENCOUNTER — Other Ambulatory Visit (HOSPITAL_COMMUNITY): Payer: Self-pay | Admitting: Family Medicine

## 2021-03-04 DIAGNOSIS — Z1231 Encounter for screening mammogram for malignant neoplasm of breast: Secondary | ICD-10-CM

## 2021-03-05 ENCOUNTER — Other Ambulatory Visit: Payer: Self-pay | Admitting: Nurse Practitioner

## 2021-03-05 ENCOUNTER — Encounter: Payer: Self-pay | Admitting: Nurse Practitioner

## 2021-03-05 DIAGNOSIS — Z1211 Encounter for screening for malignant neoplasm of colon: Secondary | ICD-10-CM

## 2021-04-12 ENCOUNTER — Other Ambulatory Visit: Payer: Self-pay

## 2021-04-12 ENCOUNTER — Encounter (HOSPITAL_COMMUNITY): Payer: Self-pay

## 2021-04-12 ENCOUNTER — Other Ambulatory Visit (HOSPITAL_COMMUNITY): Payer: Self-pay | Admitting: Family Medicine

## 2021-04-12 ENCOUNTER — Ambulatory Visit (HOSPITAL_COMMUNITY)
Admission: RE | Admit: 2021-04-12 | Discharge: 2021-04-12 | Disposition: A | Discharge status: Home / Self Care | Payer: 59 | Source: Ambulatory Visit | Attending: Family Medicine | Admitting: Family Medicine

## 2021-04-12 DIAGNOSIS — Z1231 Encounter for screening mammogram for malignant neoplasm of breast: Secondary | ICD-10-CM | POA: Insufficient documentation

## 2021-04-16 ENCOUNTER — Other Ambulatory Visit (HOSPITAL_COMMUNITY): Payer: Self-pay | Admitting: Nurse Practitioner

## 2021-04-16 DIAGNOSIS — N644 Mastodynia: Secondary | ICD-10-CM

## 2021-04-26 ENCOUNTER — Other Ambulatory Visit: Payer: Self-pay

## 2021-04-26 ENCOUNTER — Ambulatory Visit (INDEPENDENT_AMBULATORY_CARE_PROVIDER_SITE_OTHER): Payer: No Typology Code available for payment source | Admitting: Nurse Practitioner

## 2021-04-26 ENCOUNTER — Encounter: Payer: Self-pay | Admitting: Nurse Practitioner

## 2021-04-26 VITALS — BP 135/86 | HR 77 | Temp 97.4°F | Ht 62.5 in | Wt 158.0 lb

## 2021-04-26 DIAGNOSIS — R7301 Impaired fasting glucose: Secondary | ICD-10-CM

## 2021-04-26 DIAGNOSIS — Z23 Encounter for immunization: Secondary | ICD-10-CM | POA: Diagnosis not present

## 2021-04-26 DIAGNOSIS — Z01419 Encounter for gynecological examination (general) (routine) without abnormal findings: Secondary | ICD-10-CM

## 2021-04-26 DIAGNOSIS — E7800 Pure hypercholesterolemia, unspecified: Secondary | ICD-10-CM | POA: Diagnosis not present

## 2021-04-26 MED ORDER — ATORVASTATIN CALCIUM 20 MG PO TABS: 20.0000 mg | ORAL_TABLET | Freq: Every day | ORAL | 0 refills | Status: DC

## 2021-04-26 MED ORDER — EPINEPHRINE 0.3 MG/0.3ML IJ SOAJ: 0.3000 mg | INTRAMUSCULAR | 0 refills | Status: DC | PRN

## 2021-04-26 NOTE — Progress Notes (Signed)
Subjective:    Patient ID: Jane Baker, female    DOB: 1965-10-21, 55 y.o.   MRN: [id number]  HPI The patient comes in today for a wellness visit.    A review of their health history was completed.  A review of medications was also completed.  Any needed refills; discuss cholesterol medication  Eating habits: excellent  Falls/  MVA accidents in past few months: no  Regular exercise: regularly; walking and light weights  Specialist pt sees on regular basis: no  Preventative health issues were discussed.  Has had hysterectomy with BSO. Same sexual partner. Defers need for STI testing.  Due for routine labs.  Did not start Lipitor due to concerns about possible risk of dementia. Her mother has been on medication for over 50 years and has developed dementia but there is also a grandparent that had dementia as well. Unsure about the type. Strong [facility] of CVD and hyperlipidemia. Regular vision and dental exams.  Has had colonoscopy. Report brought in today. Negative for cancer, but did have polyps. Repeat in 5 years.    Review of Systems  Constitutional:  Negative for activity change and appetite change.  HENT:  Negative for sore throat and trouble swallowing.   Respiratory:  Negative for cough, chest tightness, shortness of breath and wheezing.   Cardiovascular:  Negative for chest pain.  Gastrointestinal:  Negative for abdominal distention, abdominal pain, constipation, diarrhea, nausea and vomiting.  Genitourinary:  Negative for difficulty urinating, dysuria, enuresis, frequency, genital sores, menstrual problem, pelvic pain, urgency and vaginal discharge.  Depression screen PHQ 2/9 04/26/2021  Decreased Interest 0  Down, Depressed, Hopeless 0  PHQ - 2 Score 0  Altered sleeping -  Tired, decreased energy -  Change in appetite -  Feeling bad or failure about yourself  -  Trouble concentrating -  Moving slowly or fidgety/restless -  Suicidal thoughts -  PHQ-9 Score  -  Difficult doing work/chores -        Objective:   Physical Exam Constitutional:      General: She is not in acute distress.    Appearance: She is well-developed.  Neck:     Thyroid: No thyromegaly.     Trachea: No tracheal deviation.     Comments: Thyroid non tender to palpation. No mass or goiter noted.  Cardiovascular:     Rate and Rhythm: Normal rate and regular rhythm.     Heart sounds: Normal heart sounds. No murmur heard. Pulmonary:     Effort: Pulmonary effort is normal.     Breath sounds: Normal breath sounds.  Chest:  Breasts:    Right: No inverted nipple, mass, skin change or tenderness.     Left: No inverted nipple, mass, skin change or tenderness.  Abdominal:     General: There is no distension.     Palpations: Abdomen is soft.     Tenderness: There is no abdominal tenderness.  Genitourinary:    Comments: Defers GU exam. Denies any problems. Musculoskeletal:     Cervical back: Normal range of motion and neck supple.  Lymphadenopathy:     Cervical: No cervical adenopathy.     Upper Body:     Right upper body: No supraclavicular, axillary or pectoral adenopathy.     Left upper body: No supraclavicular, axillary or pectoral adenopathy.  Skin:    General: Skin is warm and dry.  Neurological:     Mental Status: She is alert and oriented to person, place, and time.  Psychiatric:        Mood and Affect: Mood normal.        Behavior: Behavior normal.        Thought Content: Thought content normal.        Judgment: Judgment normal.   Today's Vitals   04/26/21 0822  BP: 135/86  Pulse: 77  Temp: (!) 97.4 F (36.3 C)  SpO2: 97%  Weight: 158 lb (71.7 kg)  Height: 5' 2.5" (1.588 m)   Body mass index is 28.44 kg/m.        Assessment & Plan:   Problem List Items Addressed This Visit       Other   Hyperlipidemia   Relevant Medications   EPINEPHrine 0.3 mg/0.3 mL IJ SOAJ injection   atorvastatin (LIPITOR) 20 MG tablet   Other Relevant Orders    CBC with Differential/Platelet (Completed)   Comprehensive metabolic panel (Completed)   Lipid panel (Completed)   Hemoglobin A1c (Completed)   Other Visit Diagnoses     Well woman exam    -  Primary   Relevant Orders   CBC with Differential/Platelet (Completed)   Comprehensive metabolic panel (Completed)   Lipid panel (Completed)   Hemoglobin A1c (Completed)   Impaired fasting glucose       Relevant Orders   CBC with Differential/Platelet (Completed)   Comprehensive metabolic panel (Completed)   Lipid panel (Completed)   Hemoglobin A1c (Completed)   Immunization due       Relevant Orders   Flu Vaccine QUAD 58mo+IM (Fluarix, Fluzone & Alfiuria Quad PF) (Completed)      Meds ordered this encounter  Medications   EPINEPHrine 0.3 mg/0.3 mL IJ SOAJ injection    Sig: Inject 0.3 mg into the muscle as needed for anaphylaxis.    Dispense:  1 each    Refill:  0    Order Specific Question:   Supervising Provider    Answer:   Sallee Lange A [[id number]]   atorvastatin (LIPITOR) 20 MG tablet    Sig: Take 1 tablet (20 mg total) by mouth daily.    Dispense:  90 tablet    Refill:  0    Order Specific Question:   Supervising Provider    Answer:   Sallee Lange A [[id number]]    Routine labs ordered. Flu vaccine today. Encouraged updated COVID booster. Discussed risks and benefits of statin use especially with very high LDL on last labs (over 200). Patient wants to restart Lipitor. Repeat labs in 8-10 weeks. Encouraged patient to get more information on her mother's dementia diagnosis. There appears to be genetic component.  Mammogram scheduled for December. Continue healthy habits.  Return in about 6 months (around 10/24/2021) for cholesterol.

## 2021-04-27 ENCOUNTER — Encounter: Payer: Self-pay | Admitting: Nurse Practitioner

## 2021-04-27 LAB — CBC WITH DIFFERENTIAL/PLATELET
Basophils Absolute: 0 10*3/uL (ref 0.0–0.2)
Basos: 1 %
EOS (ABSOLUTE): 0 10*3/uL (ref 0.0–0.4)
Eos: 1 %
Hematocrit: 43.3 % (ref 34.0–46.6)
Hemoglobin: 14.3 g/dL (ref 11.1–15.9)
Immature Grans (Abs): 0 10*3/uL (ref 0.0–0.1)
Immature Granulocytes: 0 %
Lymphocytes Absolute: 2.1 10*3/uL (ref 0.7–3.1)
Lymphs: 37 %
MCH: 29.5 pg (ref 26.6–33.0)
MCHC: 33 g/dL (ref 31.5–35.7)
MCV: 90 fL (ref 79–97)
Monocytes Absolute: 0.5 10*3/uL (ref 0.1–0.9)
Monocytes: 9 %
Neutrophils Absolute: 3.1 10*3/uL (ref 1.4–7.0)
Neutrophils: 52 %
Platelets: 298 10*3/uL (ref 150–450)
RBC: 4.84 x10E6/uL (ref 3.77–5.28)
RDW: 12.3 % (ref 11.7–15.4)
WBC: 5.8 10*3/uL (ref 3.4–10.8)

## 2021-04-27 LAB — COMPREHENSIVE METABOLIC PANEL
ALT: 19 IU/L (ref 0–32)
AST: 22 IU/L (ref 0–40)
Albumin/Globulin Ratio: 1.7 (ref 1.2–2.2)
Albumin: 4.7 g/dL (ref 3.8–4.9)
Alkaline Phosphatase: 93 IU/L (ref 44–121)
BUN/Creatinine Ratio: 11 (ref 9–23)
BUN: 11 mg/dL (ref 6–24)
Bilirubin Total: 0.9 mg/dL (ref 0.0–1.2)
CO2: 24 mmol/L (ref 20–29)
Calcium: 10.1 mg/dL (ref 8.7–10.2)
Chloride: 102 mmol/L (ref 96–106)
Creatinine, Ser: 0.99 mg/dL (ref 0.57–1.00)
Globulin, Total: 2.7 g/dL (ref 1.5–4.5)
Glucose: 99 mg/dL (ref 70–99)
Potassium: 4.9 mmol/L (ref 3.5–5.2)
Sodium: 140 mmol/L (ref 134–144)
Total Protein: 7.4 g/dL (ref 6.0–8.5)
eGFR: 67 mL/min/{1.73_m2} (ref >59)

## 2021-04-27 LAB — LIPID PANEL
Chol/HDL Ratio: 4.9 ratio — ABNORMAL HIGH (ref 0.0–4.4)
Cholesterol, Total: 286 mg/dL — ABNORMAL HIGH (ref 100–199)
HDL: 58 mg/dL (ref >39)
LDL Chol Calc (NIH): 206 mg/dL — ABNORMAL HIGH (ref 0–99)
Triglycerides: 121 mg/dL (ref 0–149)
VLDL Cholesterol Cal: 22 mg/dL (ref 5–40)

## 2021-04-27 LAB — HEMOGLOBIN A1C
Est. average glucose Bld gHb Est-mCnc: 108 mg/dL
Hgb A1c MFr Bld: 5.4 % (ref 4.8–5.6)

## 2021-05-28 ENCOUNTER — Other Ambulatory Visit: Payer: Self-pay

## 2021-05-28 ENCOUNTER — Ambulatory Visit (HOSPITAL_COMMUNITY)
Admission: RE | Admit: 2021-05-28 | Discharge: 2021-05-28 | Disposition: A | Discharge status: Home / Self Care | Payer: 59 | Source: Ambulatory Visit | Attending: Nurse Practitioner | Admitting: Nurse Practitioner

## 2021-05-28 ENCOUNTER — Encounter (HOSPITAL_COMMUNITY): Payer: Self-pay

## 2021-05-28 DIAGNOSIS — N644 Mastodynia: Secondary | ICD-10-CM

## 2021-06-11 ENCOUNTER — Other Ambulatory Visit: Payer: Self-pay | Admitting: Nurse Practitioner

## 2021-06-11 DIAGNOSIS — Z803 Family history of malignant neoplasm of breast: Secondary | ICD-10-CM

## 2021-06-11 DIAGNOSIS — R922 Inconclusive mammogram: Secondary | ICD-10-CM

## 2021-06-14 ENCOUNTER — Encounter: Payer: Self-pay | Admitting: Nurse Practitioner

## 2021-06-14 DIAGNOSIS — R9230 Dense breasts, unspecified: Secondary | ICD-10-CM | POA: Insufficient documentation

## 2021-06-14 DIAGNOSIS — R922 Inconclusive mammogram: Secondary | ICD-10-CM | POA: Insufficient documentation

## 2021-07-08 ENCOUNTER — Inpatient Hospital Stay: Payer: 59

## 2021-07-08 ENCOUNTER — Inpatient Hospital Stay: Payer: 59 | Admitting: Licensed Clinical Social Worker

## 2021-07-15 ENCOUNTER — Encounter: Payer: Self-pay | Admitting: Nurse Practitioner

## 2021-07-15 ENCOUNTER — Other Ambulatory Visit: Payer: Self-pay | Admitting: Nurse Practitioner

## 2021-07-15 DIAGNOSIS — Z79899 Other long term (current) drug therapy: Secondary | ICD-10-CM

## 2021-07-15 DIAGNOSIS — E785 Hyperlipidemia, unspecified: Secondary | ICD-10-CM

## 2021-07-20 ENCOUNTER — Other Ambulatory Visit: Payer: Self-pay | Admitting: Nurse Practitioner

## 2021-07-20 LAB — HEPATIC FUNCTION PANEL
ALT: 19 IU/L (ref 0–32)
AST: 24 IU/L (ref 0–40)
Albumin: 4.8 g/dL (ref 3.8–4.9)
Alkaline Phosphatase: 97 IU/L (ref 44–121)
Bilirubin Total: 0.6 mg/dL (ref 0.0–1.2)
Bilirubin, Direct: 0.16 mg/dL (ref 0.00–0.40)
Total Protein: 7.4 g/dL (ref 6.0–8.5)

## 2021-07-20 LAB — LIPID PANEL
Chol/HDL Ratio: 3.8 ratio (ref 0.0–4.4)
Cholesterol, Total: 177 mg/dL (ref 100–199)
HDL: 46 mg/dL (ref >39)
LDL Chol Calc (NIH): 106 mg/dL — ABNORMAL HIGH (ref 0–99)
Triglycerides: 143 mg/dL (ref 0–149)
VLDL Cholesterol Cal: 25 mg/dL (ref 5–40)

## 2021-07-22 ENCOUNTER — Other Ambulatory Visit: Payer: 59

## 2021-07-22 ENCOUNTER — Other Ambulatory Visit: Payer: Self-pay | Admitting: Nurse Practitioner

## 2021-07-22 ENCOUNTER — Encounter: Payer: 59 | Admitting: Hematology and Oncology

## 2021-07-25 ENCOUNTER — Other Ambulatory Visit: Payer: Self-pay | Admitting: Nurse Practitioner

## 2021-07-25 MED ORDER — ATORVASTATIN CALCIUM 20 MG PO TABS: 20.0000 mg | ORAL_TABLET | Freq: Every day | ORAL | 0 refills | Status: DC

## 2021-07-26 MED ORDER — ATORVASTATIN CALCIUM 20 MG PO TABS: 20.0000 mg | ORAL_TABLET | Freq: Every day | ORAL | 0 refills | Status: DC

## 2021-10-22 ENCOUNTER — Other Ambulatory Visit: Payer: Self-pay | Admitting: Nurse Practitioner

## 2021-10-24 ENCOUNTER — Ambulatory Visit (INDEPENDENT_AMBULATORY_CARE_PROVIDER_SITE_OTHER): Payer: No Typology Code available for payment source | Admitting: Family Medicine

## 2021-10-24 ENCOUNTER — Encounter: Payer: Self-pay | Admitting: Family Medicine

## 2021-10-24 DIAGNOSIS — E785 Hyperlipidemia, unspecified: Secondary | ICD-10-CM

## 2021-10-24 MED ORDER — ATORVASTATIN CALCIUM 20 MG PO TABS: 20.0000 mg | ORAL_TABLET | Freq: Every day | ORAL | 3 refills | Status: DC

## 2021-10-24 MED ORDER — ALBUTEROL SULFATE HFA 108 (90 BASE) MCG/ACT IN AERS: 2.0000 | INHALATION_SPRAY | Freq: Four times a day (QID) | RESPIRATORY_TRACT | 6 refills | Status: DC | PRN

## 2021-10-24 NOTE — Patient Instructions (Signed)
Consider increase in Lipitor. ? ?Inquire about the cost of shingles vaccination at your pharmacy. ? ?Follow up annually. ?

## 2021-10-24 NOTE — Progress Notes (Signed)
? ?Subjective:  ?Patient ID: Jane Baker, female    DOB: 1965/09/27  Age: 56 y.o. MRN: [id number] ? ?CC: ?Chief Complaint  ?Patient presents with  ? Establish Care  ?  Pt here to meet provider-no issues/concerns.   ? ? ?HPI: ? ?56 year old female with hyperlipidemia presents for follow-up. ? 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  She has previously had LDL greater than 200.  She is currently on Lipitor 20 mg daily and doing well.  Her most recent LDL was 106.  Patient requesting refill on Lipitor today. ? ?Patient denies chest pain or shortness of breath.  She states that she is doing well. ? ?No other complaints or concerns at this time. ? ?Patient Active Problem List  ? Diagnosis Date Noted  ? Breast density 06/14/2021  ? Hyperlipidemia 01/31/2013  ? Hx estrogen therapy 12/30/2012  ? ? ?Social Hx   ?Social History  ? ?Socioeconomic History  ? Marital status: Married  ?  Spouse name: Not on file  ? Number of children: Not on file  ? Years of education: Not on file  ? Highest education level: Not on file  ?Occupational History  ? Not on file  ?Tobacco Use  ? Smoking status: Never  ? Smokeless tobacco: Never  ?Vaping Use  ? Vaping Use: Never used  ?Substance and Sexual Activity  ? Alcohol use: No  ? Drug use: No  ? Sexual activity: Yes  ?  Birth control/protection: Surgical  ?Other Topics Concern  ? Not on file  ?Social History Narrative  ? Not on file  ? ?Social Determinants of Health  ? ?Financial Resource Strain: Not on file  ?Food Insecurity: Not on file  ?Transportation Needs: Not on file  ?Physical Activity: Not on file  ?Stress: Not on file  ?Social Connections: Not on file  ? ? ?Review of Systems ?Per HPI ? ?Objective:  ?BP (!) 147/92   Pulse (!) 133   Temp 98.3 ?F (36.8 ?C)   Wt 158 lb (71.7 kg)   SpO2 93%   BMI 28.44 kg/m?  ? ? ?  10/24/2021  ?  8:26 AM 04/26/2021  ?  8:22 AM 04/20/2020  ? 10:42 AM  ?BP/Weight  ?Systolic BP 194 174 081  ?Diastolic BP 92 86 88  ?Wt. (Lbs) 158 158   ?BMI  28.44 kg/m2 28.44 kg/m2   ? ? ?Physical Exam ?Vitals and nursing note reviewed.  ?Constitutional:   ?   General: She is not in acute distress. ?   Appearance: Normal appearance. She is not ill-appearing.  ?HENT:  ?   Head: Normocephalic and atraumatic.  ?Eyes:  ?   General:     ?   Right eye: No discharge.     ?   Left eye: No discharge.  ?   Conjunctiva/sclera: Conjunctivae normal.  ?Cardiovascular:  ?   Rate and Rhythm: Normal rate and regular rhythm.  ?   Heart sounds: No murmur heard. ?Pulmonary:  ?   Effort: Pulmonary effort is normal.  ?   Breath sounds: Normal breath sounds. No wheezing, rhonchi or rales.  ?Neurological:  ?   Mental Status: She is alert.  ?Psychiatric:     ?   Mood and Affect: Mood normal.     ?   Behavior: Behavior normal.  ? ? ?Lab Results  ?Component Value Date  ? WBC 5.8 04/26/2021  ? HGB 14.3 04/26/2021  ? HCT 43.3 04/26/2021  ? PLT 298 04/26/2021  ? GLUCOSE 99  04/26/2021  ? CHOL 177 07/19/2021  ? TRIG 143 07/19/2021  ? HDL 46 07/19/2021  ? LDLCALC 106 (H) 07/19/2021  ? ALT 19 07/19/2021  ? AST 24 07/19/2021  ? NA 140 04/26/2021  ? K 4.9 04/26/2021  ? CL 102 04/26/2021  ? CREATININE 0.99 04/26/2021  ? BUN 11 04/26/2021  ? CO2 24 04/26/2021  ? TSH 2.370 08/20/2015  ? HGBA1C 5.4 04/26/2021  ? ? ? ?Assessment & Plan:  ? ?Problem List Items Addressed This Visit   ? ?  ? Other  ? Hyperlipidemia  ?  Discussed increase in Lipitor.  Patient states that she will consider.  We will continue current dose at 20 mg for now. ? ?  ?  ? Relevant Medications  ? atorvastatin (LIPITOR) 20 MG tablet  ? ? ?Meds ordered this encounter  ?Medications  ? atorvastatin (LIPITOR) 20 MG tablet  ?  Sig: Take 1 tablet (20 mg total) by mouth daily.  ?  Dispense:  90 tablet  ?  Refill:  3  ? albuterol (VENTOLIN HFA) 108 (90 Base) MCG/ACT inhaler  ?  Sig: Inhale 2 puffs into the lungs every 6 (six) hours as needed for wheezing.  ?  Dispense:  18 g  ?  Refill:  6  ?  Please dispense insurance preference  ? ? ?Follow-up:  Annually ?Thersa Salt DO ?[place] ? ?

## 2021-10-24 NOTE — Assessment & Plan Note (Signed)
Discussed increase in Lipitor.  Patient states that she will consider.  We will continue current dose at 20 mg for now. ?

## 2022-11-06 ENCOUNTER — Other Ambulatory Visit: Payer: Self-pay | Admitting: Nurse Practitioner

## 2022-11-06 ENCOUNTER — Other Ambulatory Visit (HOSPITAL_COMMUNITY): Payer: Self-pay | Admitting: Nurse Practitioner

## 2022-11-06 DIAGNOSIS — Z1231 Encounter for screening mammogram for malignant neoplasm of breast: Secondary | ICD-10-CM

## 2022-11-06 MED ORDER — EPINEPHRINE 0.3 MG/0.3ML IJ SOAJ: 0.3000 mg | INTRAMUSCULAR | 0 refills | Status: DC | PRN

## 2022-11-19 ENCOUNTER — Encounter (HOSPITAL_COMMUNITY): Payer: Self-pay

## 2022-11-19 ENCOUNTER — Ambulatory Visit (HOSPITAL_COMMUNITY)
Admission: RE | Admit: 2022-11-19 | Discharge: 2022-11-19 | Disposition: A | Discharge status: Home / Self Care | Payer: Managed Care, Other (non HMO) | Source: Ambulatory Visit | Attending: Nurse Practitioner | Admitting: Nurse Practitioner

## 2022-11-19 DIAGNOSIS — Z1231 Encounter for screening mammogram for malignant neoplasm of breast: Secondary | ICD-10-CM | POA: Diagnosis present

## 2023-01-14 ENCOUNTER — Encounter: Payer: Self-pay | Admitting: Nurse Practitioner

## 2023-01-14 ENCOUNTER — Ambulatory Visit: Payer: Managed Care, Other (non HMO) | Admitting: Nurse Practitioner

## 2023-01-14 VITALS — BP 136/78 | HR 75 | Wt 154.8 lb

## 2023-01-14 DIAGNOSIS — E785 Hyperlipidemia, unspecified: Secondary | ICD-10-CM

## 2023-01-14 DIAGNOSIS — Z1329 Encounter for screening for other suspected endocrine disorder: Secondary | ICD-10-CM

## 2023-01-14 DIAGNOSIS — Z130 Encounter for screening for diseases of the blood and blood-forming organs and certain disorders involving the immune mechanism: Secondary | ICD-10-CM

## 2023-01-14 DIAGNOSIS — Z13228 Encounter for screening for other metabolic disorders: Secondary | ICD-10-CM

## 2023-01-14 DIAGNOSIS — Z0000 Encounter for general adult medical examination without abnormal findings: Secondary | ICD-10-CM

## 2023-01-14 DIAGNOSIS — Z1159 Encounter for screening for other viral diseases: Secondary | ICD-10-CM

## 2023-01-14 DIAGNOSIS — Z114 Encounter for screening for human immunodeficiency virus [HIV]: Secondary | ICD-10-CM

## 2023-01-14 NOTE — Progress Notes (Signed)
Subjective:    Patient ID: Jane Baker, female    DOB: Apr 22, 1966, 57 y.o.   MRN: [id number]  HPI Patient arrives today with issues with hemorrhoids. Patient states the hemorrhoids have cleared up. She would like to get back on her statin and get lab work.  States the hemorrhoid flared up after heavy lifting in her yard and about with constipation.  Has had problems off and on since her last pregnancy.  Note that patient had a hysterectomy and BSO in her late 30s.  Took hormone therapy for about 5 years.  Has a strong family history of hyperlipidemia and heart disease.  Would like to get routine lab work and restart her cholesterol medicine.  Review of Systems  Respiratory:  Negative for cough, chest tightness, shortness of breath and wheezing.   Cardiovascular:  Negative for chest pain and leg swelling.  Gastrointestinal:  Negative for anal bleeding, blood in stool and constipation.       Constipation has resolved.  No further blood in her stool.      01/14/2023   10:53 AM  Depression screen PHQ 2/9  Decreased Interest 0  Down, Depressed, Hopeless 0  PHQ - 2 Score 0  Altered sleeping 2  Tired, decreased energy 1  Change in appetite 0  Feeling bad or failure about yourself  0  Trouble concentrating 0  Moving slowly or fidgety/restless 0  Suicidal thoughts 0  PHQ-9 Score 3  Difficult doing work/chores Not difficult at all      01/14/2023   10:53 AM  GAD 7 : Generalized Anxiety Score  Nervous, Anxious, on Edge 0  Control/stop worrying 0  Worry too much - different things 0  Trouble relaxing 0  Restless 0  Easily annoyed or irritable 0  Afraid - awful might happen 0  Total GAD 7 Score 0  Anxiety Difficulty Not difficult at all          Objective:   Physical Exam NAD.  Alert, oriented.  Lungs clear.  Heart regular rate rhythm.  Lower extremities no edema. Today's Vitals   01/14/23 0827  BP: 136/78  Pulse: 75  SpO2: 100%  Weight: 154 lb 12.8 oz (70.2 kg)    Body mass index is 27.86 kg/m.        Assessment & Plan:   Problem List Items Addressed This Visit       Other   Hyperlipidemia - Primary   Relevant Orders   Lipid panel   Other Visit Diagnoses     Annual physical exam       Relevant Orders   CBC with Differential/Platelet   Comprehensive metabolic panel   Lipid panel   TSH   HIV Antibody (routine testing w rflx)   Hepatitis C Antibody   Screening for deficiency anemia       Relevant Orders   CBC with Differential/Platelet   Screening for metabolic disorder       Relevant Orders   Comprehensive metabolic panel   Screening for thyroid disorder       Relevant Orders   TSH   Screening for HIV (human immunodeficiency virus)       Relevant Orders   HIV Antibody (routine testing w rflx)   Need for hepatitis C screening test       Relevant Orders   Hepatitis C Antibody      Labs pending.  Will restart atorvastatin after labs in case dosage needs to be adjusted.  Recommended shingles  vaccine at local pharmacy.  Recommend preventive health physical in the near future.

## 2023-01-15 ENCOUNTER — Other Ambulatory Visit: Payer: Self-pay | Admitting: Nurse Practitioner

## 2023-01-15 DIAGNOSIS — Z79899 Other long term (current) drug therapy: Secondary | ICD-10-CM

## 2023-01-15 DIAGNOSIS — E785 Hyperlipidemia, unspecified: Secondary | ICD-10-CM

## 2023-01-15 MED ORDER — ATORVASTATIN CALCIUM 20 MG PO TABS: 20.0000 mg | ORAL_TABLET | Freq: Every day | ORAL | 0 refills | Status: DC

## 2023-01-28 ENCOUNTER — Encounter: Payer: Self-pay | Admitting: Nurse Practitioner

## 2023-01-28 ENCOUNTER — Ambulatory Visit: Payer: Managed Care, Other (non HMO) | Admitting: Nurse Practitioner

## 2023-01-28 VITALS — BP 143/93 | HR 50 | Ht 62.0 in | Wt 154.6 lb

## 2023-01-28 DIAGNOSIS — Z01411 Encounter for gynecological examination (general) (routine) with abnormal findings: Secondary | ICD-10-CM | POA: Diagnosis not present

## 2023-01-28 DIAGNOSIS — K644 Residual hemorrhoidal skin tags: Secondary | ICD-10-CM | POA: Diagnosis not present

## 2023-01-28 DIAGNOSIS — Z01419 Encounter for gynecological examination (general) (routine) without abnormal findings: Secondary | ICD-10-CM

## 2023-01-28 MED ORDER — HYDROCORTISONE ACE-PRAMOXINE 2.5-1 % EX CREA: TOPICAL_CREAM | CUTANEOUS | 0 refills | Status: DC

## 2023-01-28 NOTE — Progress Notes (Addendum)
Subjective:    Patient ID: Jane Baker, female    DOB: Oct 16, 1965, 57 y.o.   MRN: [id number]  HPI The patient comes in today for a wellness visit.    A review of their health history was completed.  A review of medications was also completed.  Any needed refills; no  Eating habits: eating healthy; limited meat; takes daily MVI with B12 and vitamin D  Falls/  MVA accidents in past few months: no  Regular exercise: yes, walking; active lifestyle  Specialist pt sees on regular basis: no  Preventative health issues were discussed.   Additional concerns: problems with hemorrhoids; minimal bleeding only occasionally with wiping; causes pain and itching at times; has tried Tucks pads and OTC hemorrhoid cream; best with use of Advil; occasional mild fishy odor from rectum Same sexual partner; some pain at introitus during sex Rare use of albuterol for wheezing; mainly when working outside Regular vision and dental care Mammogram and colonoscopy up to date   Review of Systems  Constitutional:  Negative for activity change and appetite change.  HENT:  Negative for sore throat and trouble swallowing.   Respiratory:  Positive for wheezing. Negative for cough, chest tightness and shortness of breath.        Minimal wheezing only with working outdoors  Cardiovascular:  Negative for chest pain.  Gastrointestinal:  Positive for blood in stool. Negative for abdominal distention, abdominal pain, constipation, diarrhea, nausea and vomiting.       Rare bright red blood in stool with wiping due to external hemorrhoids  Genitourinary:  Positive for dyspareunia. Negative for difficulty urinating, dysuria, enuresis, frequency, genital sores, pelvic pain, urgency and vaginal discharge.      01/28/2023    8:31 AM  Depression screen PHQ 2/9  Decreased Interest 0  Down, Depressed, Hopeless 0  PHQ - 2 Score 0  Altered sleeping 1  Tired, decreased energy 0  Change in appetite 0   Feeling bad or failure about yourself  0  Trouble concentrating 0  Moving slowly or fidgety/restless 0  Suicidal thoughts 0  PHQ-9 Score 1  Difficult doing work/chores Not difficult at all      01/28/2023    8:32 AM 01/14/2023   10:53 AM  GAD 7 : Generalized Anxiety Score  Nervous, Anxious, on Edge 0 0  Control/stop worrying 0 0  Worry too much - different things 0 0  Trouble relaxing 0 0  Restless 0 0  Easily annoyed or irritable 0 0  Afraid - awful might happen 0 0  Total GAD 7 Score 0 0  Anxiety Difficulty  Not difficult at all         Objective:   Physical Exam Vitals and nursing note reviewed.  Constitutional:      General: She is not in acute distress.    Appearance: She is well-developed.  Neck:     Thyroid: No thyromegaly.     Trachea: No tracheal deviation.     Comments: Thyroid non tender to palpation. No mass or goiter noted.  Cardiovascular:     Rate and Rhythm: Normal rate and regular rhythm.     Heart sounds: Normal heart sounds. No murmur heard. Pulmonary:     Effort: Pulmonary effort is normal.     Breath sounds: Normal breath sounds.  Chest:  Breasts:    Right: No swelling, inverted nipple, mass, skin change or tenderness.     Left: No swelling, inverted nipple, mass, skin change or  tenderness.  Abdominal:     General: There is no distension.     Palpations: Abdomen is soft.     Tenderness: There is no abdominal tenderness.  Genitourinary:    Comments: Vulvar area: no rash, lesions or discoloration. Vagina: slightly pale, no discharge. Cervix surgically absent. Discomfort at the introitus with insertion of the speculum. Multiple pink external hemorrhoids noted. No erythema or discoloration.  Musculoskeletal:     Cervical back: Normal range of motion and neck supple.  Lymphadenopathy:     Cervical: No cervical adenopathy.     Upper Body:     Right upper body: No supraclavicular, axillary or pectoral adenopathy.     Left upper body: No  supraclavicular, axillary or pectoral adenopathy.  Skin:    General: Skin is warm and dry.  Neurological:     Mental Status: She is alert and oriented to person, place, and time.  Psychiatric:        Mood and Affect: Mood normal.        Behavior: Behavior normal.        Thought Content: Thought content normal.        Judgment: Judgment normal.    Today's Vitals   01/28/23 0828  BP: (!) 143/93  Pulse: (!) 50  SpO2: 95%  Weight: 154 lb 9.6 oz (70.1 kg)  Height: 5\' 2"  (1.575 m)   Body mass index is 28.28 kg/m.         Assessment & Plan:   Problem List Items Addressed This Visit       Cardiovascular and Mediastinum   External hemorrhoids   Other Visit Diagnoses     Well woman exam    -  Primary     She has restarted her cholesterol medication. Repeat labs in about 3 months.  Defers referral for hemorrhoids at this time. Trial of medication to see if this will help. Continue to avoid constipation as much as possible. Call back if worsens.  Meds ordered this encounter  Medications   Pramoxine-HC (HYDROCORTISONE ACE-PRAMOXINE) 2.5-1 % CREA    Sig: Apply externally to hemorrhoids TID prn pain or itching    Dispense:  57 g    Refill:  0    Order Specific Question:   Supervising Provider    Answer:   Lilyan Punt A [id number]   Call back if using Albuterol inhaler more than 2-3 times per week. Consider adding inhaled steroid to regimen. Recommend flu vaccine and Shingrix at local pharmacy.  Return in about 1 year (around 01/28/2024) for physical.

## 2023-01-28 NOTE — Patient Instructions (Signed)
Jane Baker  ?

## 2023-01-30 ENCOUNTER — Other Ambulatory Visit: Payer: Self-pay | Admitting: Nurse Practitioner

## 2023-02-03 ENCOUNTER — Encounter: Payer: Self-pay | Admitting: Nurse Practitioner

## 2023-02-03 ENCOUNTER — Other Ambulatory Visit: Payer: Self-pay | Admitting: Nurse Practitioner

## 2023-02-03 MED ORDER — BUDESONIDE-FORMOTEROL FUMARATE 80-4.5 MCG/ACT IN AERO: 2.0000 | INHALATION_SPRAY | Freq: Two times a day (BID) | RESPIRATORY_TRACT | 2 refills | Status: DC

## 2023-02-05 ENCOUNTER — Other Ambulatory Visit: Payer: Self-pay | Admitting: Nurse Practitioner

## 2023-02-05 ENCOUNTER — Encounter: Payer: Self-pay | Admitting: Nurse Practitioner

## 2023-02-05 MED ORDER — FLUTICASONE-SALMETEROL 100-50 MCG/ACT IN AEPB: 1.0000 | INHALATION_SPRAY | Freq: Two times a day (BID) | RESPIRATORY_TRACT | 2 refills | Status: DC

## 2023-04-10 ENCOUNTER — Telehealth: Payer: Self-pay | Admitting: Family Medicine

## 2023-04-10 MED ORDER — ATORVASTATIN CALCIUM 20 MG PO TABS: 20.0000 mg | ORAL_TABLET | Freq: Every day | ORAL | 0 refills | Status: DC

## 2023-04-10 NOTE — Telephone Encounter (Signed)
Received via fax Rx request: Prescription sent electronically to pharmacy  

## 2023-04-10 NOTE — Telephone Encounter (Signed)
Refill on atorvastatin (LIPITOR) 20 MG tablet send  to [address]

## 2023-04-11 LAB — LIPID PANEL
Chol/HDL Ratio: 3.6 {ratio} (ref 0.0–4.4)
Cholesterol, Total: 197 mg/dL (ref 100–199)
HDL: 55 mg/dL (ref >39)
LDL Chol Calc (NIH): 120 mg/dL — ABNORMAL HIGH (ref 0–99)
Triglycerides: 126 mg/dL (ref 0–149)
VLDL Cholesterol Cal: 22 mg/dL (ref 5–40)

## 2023-04-11 LAB — HEPATIC FUNCTION PANEL
ALT: 18 [IU]/L (ref 0–32)
AST: 21 [IU]/L (ref 0–40)
Albumin: 4.5 g/dL (ref 3.8–4.9)
Alkaline Phosphatase: 93 [IU]/L (ref 44–121)
Bilirubin Total: 1.1 mg/dL (ref 0.0–1.2)
Bilirubin, Direct: 0.21 mg/dL (ref 0.00–0.40)
Total Protein: 7.2 g/dL (ref 6.0–8.5)

## 2023-07-08 ENCOUNTER — Other Ambulatory Visit: Payer: Self-pay | Admitting: Family Medicine

## 2023-10-06 ENCOUNTER — Other Ambulatory Visit: Payer: Self-pay | Admitting: Family Medicine

## 2023-12-07 ENCOUNTER — Other Ambulatory Visit: Payer: Self-pay | Admitting: Nurse Practitioner

## 2023-12-07 ENCOUNTER — Encounter: Payer: Self-pay | Admitting: Nurse Practitioner

## 2023-12-07 MED ORDER — ALBUTEROL SULFATE HFA 108 (90 BASE) MCG/ACT IN AERS: 2.0000 | INHALATION_SPRAY | Freq: Four times a day (QID) | RESPIRATORY_TRACT | 2 refills | Status: DC | PRN

## 2023-12-07 MED ORDER — EPINEPHRINE 0.3 MG/0.3ML IJ SOAJ: 0.3000 mg | INTRAMUSCULAR | 0 refills | Status: AC | PRN

## 2024-01-12 ENCOUNTER — Other Ambulatory Visit: Payer: Self-pay | Admitting: Family Medicine

## 2024-02-22 ENCOUNTER — Telehealth: Payer: Self-pay | Admitting: Family Medicine

## 2024-02-22 NOTE — Telephone Encounter (Signed)
 Refill on albuterol  HFA INH  (200 Puffs) [facility]

## 2024-02-22 NOTE — Telephone Encounter (Signed)
 Patient needs appointment for refill

## 2024-02-23 NOTE — Telephone Encounter (Signed)
 Spoke with patient this morning she stated didn't need appointment already had medication on hand.

## 2024-03-07 ENCOUNTER — Ambulatory Visit (INDEPENDENT_AMBULATORY_CARE_PROVIDER_SITE_OTHER): Admitting: Physician Assistant

## 2024-03-07 VITALS — BP 133/87 | Wt 152.2 lb

## 2024-03-07 DIAGNOSIS — R1031 Right lower quadrant pain: Secondary | ICD-10-CM | POA: Diagnosis not present

## 2024-03-07 DIAGNOSIS — E785 Hyperlipidemia, unspecified: Secondary | ICD-10-CM

## 2024-03-07 NOTE — Progress Notes (Unsigned)
 Acute Office Visit  Subjective:     Patient ID: Jane Baker, female    DOB: Aug 10, 1965, 58 y.o.   MRN: [id number]  Discussed the use of AI scribe software for clinical note transcription with the patient, who gave verbal consent to proceed.  History of Present Illness Jane Baker is a 58 year old female who presents with right lower abdominal pain and diarrhea.  She has experienced right lower abdominal pain for three weeks, initially dull and now including sharp pains every morning for the past week. The pain is located in the right lower quadrant, sometimes radiating to the upper lower back, described as 'heavy' with a constant dull ache and occasional sharp pain. Advil PM at night helps her sleep, but she avoids it during the day.  Diarrhea has been present for four days, now resolving with partially normal stool this morning. Her usual morning bowel movements have been disrupted. There is no blood in her stool, nausea, vomiting, or changes in urination. She denies shortness of breath, chest pain, or fever.  She manages acid reflux with Tums and has been on a bland diet, including bananas and plain rice, without improvement in symptoms. She is concerned due to her family history of abdominal cancer, as her oldest sister passed away from it. Her last colonoscopy was in 2022, with the next due in two years.   Review of Systems  Constitutional:  Negative for chills, fever and malaise/fatigue.  Respiratory:  Negative for cough and shortness of breath.   Cardiovascular:  Negative for chest pain and palpitations.  Gastrointestinal:  Positive for abdominal pain and diarrhea. Negative for blood in stool, nausea and vomiting.  Skin:  Negative for rash.  Neurological:  Negative for dizziness and headaches.        Objective:     BP 133/87   Wt 152 lb 3.2 oz (69 kg)   BMI 27.84 kg/m   Physical Exam Constitutional:      General: She is not in acute distress.     Appearance: She is well-developed and normal weight. She is not ill-appearing.  HENT:     Head: Atraumatic.     Nose: Nose normal.     Mouth/Throat:     Mouth: Mucous membranes are moist.     Pharynx: Oropharynx is clear.  Eyes:     Extraocular Movements: Extraocular movements intact.     Conjunctiva/sclera: Conjunctivae normal.  Cardiovascular:     Rate and Rhythm: Normal rate and regular rhythm.     Heart sounds: Normal heart sounds. No murmur heard. Pulmonary:     Effort: Pulmonary effort is normal.     Breath sounds: Normal breath sounds.  Abdominal:     General: Abdomen is flat. Bowel sounds are normal.     Palpations: Abdomen is soft. There is no mass.     Tenderness: There is no abdominal tenderness. There is no guarding or rebound.     Hernia: No hernia is present.  Skin:    General: Skin is warm and dry.  Neurological:     General: No focal deficit present.     Mental Status: She is alert and oriented to person, place, and time.  Psychiatric:        Mood and Affect: Mood normal.     Results for orders placed or performed in visit on 03/07/24  Comprehensive metabolic panel with GFR  Result Value Ref Range   Glucose 94 70 - 99 mg/dL  BUN 10 8 - 27 mg/dL   Creatinine, Ser 8.96 (H) 0.57 - 1.00 mg/dL   eGFR 62 >40 fO/fpw/8.26   BUN/Creatinine Ratio 10 (L) 12 - 28   Sodium 137 134 - 144 mmol/L   Potassium 4.0 3.5 - 5.2 mmol/L   Chloride 100 96 - 106 mmol/L   CO2 21 20 - 29 mmol/L   Calcium  10.3 8.7 - 10.3 mg/dL   Total Protein 7.7 6.0 - 8.5 g/dL   Albumin 4.9 3.8 - 4.9 g/dL   Globulin, Total 2.8 1.5 - 4.5 g/dL   Bilirubin Total 0.8 0.0 - 1.2 mg/dL   Alkaline Phosphatase 93 49 - 135 IU/L   AST 27 0 - 40 IU/L   ALT 18 0 - 32 IU/L  CBC with Differential  Result Value Ref Range   WBC 7.6 3.4 - 10.8 x10E3/uL   RBC 4.86 3.77 - 5.28 x10E6/uL   Hemoglobin 14.5 11.1 - 15.9 g/dL   Hematocrit 55.9 65.9 - 46.6 %   MCV 91 79 - 97 fL   MCH 29.8 26.6 - 33.0 pg   MCHC  33.0 31.5 - 35.7 g/dL   RDW 87.7 88.2 - 84.5 %   Platelets 321 150 - 450 x10E3/uL   Neutrophils 59 Not Estab. %   Lymphs 35 Not Estab. %   Monocytes 5 Not Estab. %   Eos 1 Not Estab. %   Basos 0 Not Estab. %   Neutrophils Absolute 4.4 1.4 - 7.0 x10E3/uL   Lymphocytes Absolute 2.7 0.7 - 3.1 x10E3/uL   Monocytes Absolute 0.4 0.1 - 0.9 x10E3/uL   EOS (ABSOLUTE) 0.1 0.0 - 0.4 x10E3/uL   Basophils Absolute 0.0 0.0 - 0.2 x10E3/uL   Immature Granulocytes 0 Not Estab. %   Immature Grans (Abs) 0.0 0.0 - 0.1 x10E3/uL  Lipase  Result Value Ref Range   Lipase 32 14 - 72 U/L        Assessment & Plan:  Abdominal pain, RLQ Assessment & Plan: Right lower abdominal pain with associated diarrhea and back pain. Pain and diarrhea suggest gastrointestinal issues. Normal bowel sounds, abdomen soft and non-tender on exam. Imaging needed to rule out significant pathology. Family history of cancer noted. - Lab work to evaluate for infection and electrolyte disturbances.  - CT abdomen for pathology assessment. - Refer to GI for further evaluation. - Advise continuation of dietary modifications and OTC supportive care. - Instruct to seek emergency care if symptoms worsen or if she experiences blood in her stool, increased pain, inability to keep food, or new chest pain, shortness of breath, or syncope.   Orders: -     Comprehensive metabolic panel with GFR -     CBC with Differential/Platelet -     Lipase -     Ambulatory referral to Gastroenterology -     CT ABDOMEN PELVIS W CONTRAST  Hyperlipidemia, unspecified hyperlipidemia type -     CT CARDIAC SCORING (SELF PAY ONLY)    Return if symptoms worsen or fail to improve.  Charmaine Helmut Hennon, PA-C

## 2024-03-08 ENCOUNTER — Encounter: Payer: Self-pay | Admitting: Physician Assistant

## 2024-03-08 ENCOUNTER — Ambulatory Visit: Payer: Self-pay | Admitting: Physician Assistant

## 2024-03-08 ENCOUNTER — Other Ambulatory Visit: Payer: Self-pay | Admitting: Physician Assistant

## 2024-03-08 ENCOUNTER — Ambulatory Visit (HOSPITAL_COMMUNITY)
Admission: RE | Admit: 2024-03-08 | Discharge: 2024-03-08 | Disposition: A | Discharge status: Home / Self Care | Source: Ambulatory Visit | Attending: Physician Assistant | Admitting: Physician Assistant

## 2024-03-08 DIAGNOSIS — R1031 Right lower quadrant pain: Secondary | ICD-10-CM | POA: Insufficient documentation

## 2024-03-08 DIAGNOSIS — N2889 Other specified disorders of kidney and ureter: Secondary | ICD-10-CM

## 2024-03-08 LAB — CBC WITH DIFFERENTIAL/PLATELET
Basophils Absolute: 0 x10E3/uL (ref 0.0–0.2)
Basos: 0 %
EOS (ABSOLUTE): 0.1 x10E3/uL (ref 0.0–0.4)
Eos: 1 %
Hematocrit: 44 % (ref 34.0–46.6)
Hemoglobin: 14.5 g/dL (ref 11.1–15.9)
Immature Grans (Abs): 0 x10E3/uL (ref 0.0–0.1)
Immature Granulocytes: 0 %
Lymphocytes Absolute: 2.7 x10E3/uL (ref 0.7–3.1)
Lymphs: 35 %
MCH: 29.8 pg (ref 26.6–33.0)
MCHC: 33 g/dL (ref 31.5–35.7)
MCV: 91 fL (ref 79–97)
Monocytes Absolute: 0.4 x10E3/uL (ref 0.1–0.9)
Monocytes: 5 %
Neutrophils Absolute: 4.4 x10E3/uL (ref 1.4–7.0)
Neutrophils: 59 %
Platelets: 321 x10E3/uL (ref 150–450)
RBC: 4.86 x10E6/uL (ref 3.77–5.28)
RDW: 12.2 % (ref 11.7–15.4)
WBC: 7.6 x10E3/uL (ref 3.4–10.8)

## 2024-03-08 LAB — COMPREHENSIVE METABOLIC PANEL WITH GFR
ALT: 18 IU/L (ref 0–32)
AST: 27 IU/L (ref 0–40)
Albumin: 4.9 g/dL (ref 3.8–4.9)
Alkaline Phosphatase: 93 IU/L (ref 49–135)
BUN/Creatinine Ratio: 10 — ABNORMAL LOW (ref 12–28)
BUN: 10 mg/dL (ref 8–27)
Bilirubin Total: 0.8 mg/dL (ref 0.0–1.2)
CO2: 21 mmol/L (ref 20–29)
Calcium: 10.3 mg/dL (ref 8.7–10.3)
Chloride: 100 mmol/L (ref 96–106)
Creatinine, Ser: 1.03 mg/dL — ABNORMAL HIGH (ref 0.57–1.00)
Globulin, Total: 2.8 g/dL (ref 1.5–4.5)
Glucose: 94 mg/dL (ref 70–99)
Potassium: 4 mmol/L (ref 3.5–5.2)
Sodium: 137 mmol/L (ref 134–144)
Total Protein: 7.7 g/dL (ref 6.0–8.5)
eGFR: 62 mL/min/1.73 (ref >59)

## 2024-03-08 LAB — LIPASE: Lipase: 32 U/L (ref 14–72)

## 2024-03-08 MED ORDER — IOHEXOL 300 MG/ML  SOLN
100.0000 mL | Freq: Once | INTRAMUSCULAR | Status: AC | PRN
  Administered 2024-03-08: 100 mL via INTRAVENOUS

## 2024-03-08 NOTE — Assessment & Plan Note (Signed)
 Right lower abdominal pain with associated diarrhea and back pain. Pain and diarrhea suggest gastrointestinal issues. Normal bowel sounds, abdomen soft and non-tender on exam. Imaging needed to rule out significant pathology. Family history of cancer noted. - Lab work to evaluate for infection and electrolyte disturbances.  - CT abdomen for pathology assessment. - Refer to GI for further evaluation. - Advise continuation of dietary modifications and OTC supportive care. - Instruct to seek emergency care if symptoms worsen or if she experiences blood in her stool, increased pain, inability to keep food, or new chest pain, shortness of breath, or syncope.

## 2024-03-14 ENCOUNTER — Other Ambulatory Visit: Payer: Self-pay

## 2024-03-14 ENCOUNTER — Telehealth: Payer: Self-pay | Admitting: Family Medicine

## 2024-03-14 MED ORDER — ALBUTEROL SULFATE HFA 108 (90 BASE) MCG/ACT IN AERS: 2.0000 | INHALATION_SPRAY | Freq: Four times a day (QID) | RESPIRATORY_TRACT | 2 refills | Status: DC | PRN

## 2024-03-14 NOTE — Telephone Encounter (Signed)
 Refills sent to pharmacy.

## 2024-03-14 NOTE — Telephone Encounter (Signed)
 Refill on  albuterol (VENTOLIN HFA) 108 (90 Base) MCG/ACT inhaler  [facility]

## 2024-03-16 ENCOUNTER — Ambulatory Visit (HOSPITAL_COMMUNITY)
Admission: RE | Admit: 2024-03-16 | Discharge: 2024-03-16 | Disposition: A | Discharge status: Home / Self Care | Source: Ambulatory Visit | Attending: Urology | Admitting: Urology

## 2024-03-16 ENCOUNTER — Ambulatory Visit: Admitting: Urology

## 2024-03-16 ENCOUNTER — Encounter: Payer: Self-pay | Admitting: Urology

## 2024-03-16 VITALS — BP 139/84 | HR 73

## 2024-03-16 DIAGNOSIS — N2889 Other specified disorders of kidney and ureter: Secondary | ICD-10-CM

## 2024-03-16 LAB — URINALYSIS, ROUTINE W REFLEX MICROSCOPIC
Bilirubin, UA: NEGATIVE
Glucose, UA: NEGATIVE
Ketones, UA: NEGATIVE
Leukocytes,UA: NEGATIVE
Nitrite, UA: NEGATIVE
Protein,UA: NEGATIVE
RBC, UA: NEGATIVE
Specific Gravity, UA: <1.005 — ABNORMAL LOW (ref 1.005–1.030)
Urobilinogen, Ur: 0.2 mg/dL (ref 0.2–1.0)
pH, UA: 7 (ref 5.0–7.5)

## 2024-03-16 NOTE — Progress Notes (Signed)
 03/16/2024 8:39 AM   Jane Baker [birth year] [id number]  Referring provider: Mancil Pfeiffer, PA-C [address] [place],  [place] [address]  Left renal mass   HPI: Ms Chinita is a 58yo here for evaluation of a left renal mass. She underwent CT 9/23 for right abdominal pain and was found to have a 8.5cm left mass. CMP was normal but calcium  was 10.3. No night sweats. No unexplained weight loss.    PMH: Past Medical History:  Diagnosis Date   Asthma    GERD (gastroesophageal reflux disease)    Hx estrogen therapy 12/30/2012   Hypercholesteremia     Surgical History: Past Surgical History:  Procedure Laterality Date   ABDOMINAL HYSTERECTOMY     BILATERAL SALPINGOOPHORECTOMY     BREAST BIOPSY Left 2018   benign   ESOPHAGOGASTRODUODENOSCOPY N/A 01/11/2018   Procedure: ESOPHAGOGASTRODUODENOSCOPY (EGD);  Surgeon: Golda Claudis PENNER, MD;  Location: [facility];  Service: Endoscopy;  Laterality: N/A;  1300   MASS EXCISION N/A 02/03/2018   Procedure: EXCISION LIPOMA 3 CM ON BACK;  Surgeon: Kallie Manuelita BROCKS, MD;  Location: [facility];  Service: General;  Laterality: N/A;    Home Medications:  Allergies as of 03/16/2024       Reactions   Bee Venom Anaphylaxis, Hives, Swelling, Other (See Comments)   Wasp, yellow jackets, hornets        Medication List        Accurate as of March 16, 2024  8:39 AM. If you have any questions, ask your nurse or doctor.          albuterol  108 (90 Base) MCG/ACT inhaler Commonly known as: VENTOLIN  HFA Inhale 2 puffs into the lungs every 6 (six) hours as needed for wheezing.   atorvastatin  20 MG tablet Commonly known as: LIPITOR TAKE 1 TABLET(20 MG) BY MOUTH DAILY   EPINEPHrine  0.3 mg/0.3 mL Soaj injection Commonly known as: EPI-PEN Inject 0.3 mg into the muscle as needed for anaphylaxis.   fluticasone -salmeterol 100-50 MCG/ACT Aepb Commonly known as: Wixela Inhub Inhale 1 puff into the lungs 2 (two) times  daily.        Allergies:  Allergies  Allergen Reactions   Bee Venom Anaphylaxis, Hives, Swelling and Other (See Comments)    Wasp, yellow jackets, hornets    Family History: Family History  Problem Relation Age of Onset   Hyperlipidemia Mother    Hypertension Father    Heart disease Father    Hyperlipidemia Father    Cancer Paternal Grandmother    Heart disease Paternal Grandmother    Cancer Sister 48       died bladder and vaginal cancer    Social History:  reports that she has never smoked. She has never used smokeless tobacco. She reports that she does not drink alcohol and does not use drugs.  ROS: All other review of systems were reviewed and are negative except what is noted above in HPI  Physical Exam: BP 139/84   Pulse 73   Constitutional:  Alert and oriented, No acute distress. HEENT: [place] AT, moist mucus membranes.  Trachea midline, no masses. Cardiovascular: No clubbing, cyanosis, or edema. Respiratory: Normal respiratory effort, no increased work of breathing. GI: Abdomen is soft, nontender, nondistended, no abdominal masses GU: No CVA tenderness.  Lymph: No cervical or inguinal lymphadenopathy. Skin: No rashes, bruises or suspicious lesions. Neurologic: Grossly intact, no focal deficits, moving all 4 extremities. Psychiatric: Normal mood and affect.  Laboratory Data: Lab Results  Component Value Date   WBC 7.6 03/07/2024   HGB 14.5 03/07/2024   HCT 44.0 03/07/2024   MCV 91 03/07/2024   PLT 321 03/07/2024    Lab Results  Component Value Date   CREATININE 1.03 (H) 03/07/2024    No results found for: PSA  No results found for: TESTOSTERONE  Lab Results  Component Value Date   HGBA1C 5.4 04/26/2021    Urinalysis No results found for: COLORURINE, APPEARANCEUR, LABSPEC, PHURINE, GLUCOSEU, HGBUR, BILIRUBINUR, KETONESUR, PROTEINUR, UROBILINOGEN, NITRITE, LEUKOCYTESUR  No results found for: LABMICR, WBCUA,  RBCUA, LABEPIT, MUCUS, BACTERIA  Pertinent Imaging: CT 03/08/24: Images reviewed and discussed with the patient  No results found for this or any previous visit.  No results found for this or any previous visit.  No results found for this or any previous visit.  No results found for this or any previous visit.  No results found for this or any previous visit.  No results found for this or any previous visit.  No results found for this or any previous visit.  No results found for this or any previous visit.   Assessment & Plan:    1. Renal mass (Primary) We discussed the natural hx of renal masses and the 80/20 malignant/benign likelihood. We disucssed the treatment options including active surveillance. Renal ablation, partial and radical nephrectomy.  - Urinalysis, Routine w reflex microscopic   No follow-ups on file.  Belvie Clara, MD  [facility] [place]

## 2024-03-16 NOTE — Patient Instructions (Signed)
 A Growth in the Kidney (Renal Mass): What to Know  A renal mass is an abnormal growth in the kidney. It may be found during an MRI, CT scan, or ultrasound that's done to check for other problems in the belly. Some renal masses are cancerous, or malignant, and can grow or spread quickly. Others are benign, which means they're not cancer. Renal masses include: Tumors. These may be either cancerous or benign. The most common cancerous tumor in adults is called renal cell carcinoma. In children, the most common type is Wilms tumor. The most common kidney tumors that aren't cancer include renal adenomas, oncocytomas, and angiomyolipomas (AML). Cysts. These are pockets of fluid that form on or in the kidney. What are the causes? Certain types of cancers, infections, or injuries can cause a renal mass. It's not always known what causes a cyst to form in or on the kidney. What are the signs or symptoms? Often, a renal mass or kidney cyst doesn't cause any signs or symptoms. How is this diagnosed? Your health care provider may suggest tests to diagnose the cause of your renal mass. These tests may include: A physical exam. Blood tests. Pee (urine) tests. Imaging tests, such as: CT scan. MRI. Ultrasound. Chest X-ray or bone scan. These may be done if a tumor is cancerous to see if the cancer has spread outside the kidney. Biopsy. This is when a small piece of tissue is removed from the renal mass for testing. How is this treated? Treatment is not always needed for a renal mass. Treatment will depend on the cause of the mass and if it's causing any problems or symptoms. For a cancerous renal mass, treatment options may include: Surgery. This is done to remove the tumor and any affected tissue. Chemotherapy. This uses medicines to kill cancer cells. Radiation. High-energy X-rays or gamma rays are used to kill cancer cells. Ablation. This uses extreme hot or cold temperature to kill the cancer  cells. Immunotherapy. Medicines are used to help the body's defense system (immune system) fight the cancer cells. Taking part in clinical trials. This involves trying new or experimental treatments to see if they're effective. Most kidney cysts don't need to be treated. Follow these instructions at home: What you need to do at home will depend on the cause of the mass. Follow the instructions that your provider gives you. In general: Take medicines only as told. If you were given antibiotics, take them as told. Do not stop taking them even if you start to feel better. Follow any instructions from your provider about what you can and can't do. Do not smoke, vape, or use nicotine or tobacco. Keep all follow-up visits. Your provider will need to check if your renal mass has changed or grown. Contact a health care provider if: You have flank pain, which is pain in your side or back. You have a fever. You have a loss of appetite. You have pain or swelling in your belly. You lose weight. Get help right away if: Your pain gets worse. There's blood in your pee. You can't pee. This information is not intended to replace advice given to you by your health care provider. Make sure you discuss any questions you have with your health care provider. Document Revised: 11/28/2022 Document Reviewed: 11/28/2022 Elsevier Patient Education  2025 [employer].

## 2024-03-16 NOTE — H&P (View-Only) (Signed)
 03/16/2024 8:39 AM   Sharyne Maier [place] [birth year] [id number]  Referring provider: Mancil Pfeiffer, PA-C [address] [place],  [place] [address]  Left renal mass   HPI: Ms Chinita is a 58yo here for evaluation of a left renal mass. She underwent CT 9/23 for right abdominal pain and was found to have a 8.5cm left mass. CMP was normal but calcium  was 10.3. No night sweats. No unexplained weight loss.    PMH: Past Medical History:  Diagnosis Date   Asthma    GERD (gastroesophageal reflux disease)    Hx estrogen therapy 12/30/2012   Hypercholesteremia     Surgical History: Past Surgical History:  Procedure Laterality Date   ABDOMINAL HYSTERECTOMY     BILATERAL SALPINGOOPHORECTOMY     BREAST BIOPSY Left 2018   benign   ESOPHAGOGASTRODUODENOSCOPY N/A 01/11/2018   Procedure: ESOPHAGOGASTRODUODENOSCOPY (EGD);  Surgeon: Golda Claudis PENNER, MD;  Location: [facility];  Service: Endoscopy;  Laterality: N/A;  1300   MASS EXCISION N/A 02/03/2018   Procedure: EXCISION LIPOMA 3 CM ON BACK;  Surgeon: Kallie Manuelita BROCKS, MD;  Location: [facility];  Service: General;  Laterality: N/A;    Home Medications:  Allergies as of 03/16/2024       Reactions   Bee Venom Anaphylaxis, Hives, Swelling, Other (See Comments)   Wasp, yellow jackets, hornets        Medication List        Accurate as of March 16, 2024  8:39 AM. If you have any questions, ask your nurse or doctor.          albuterol  108 (90 Base) MCG/ACT inhaler Commonly known as: VENTOLIN  HFA Inhale 2 puffs into the lungs every 6 (six) hours as needed for wheezing.   atorvastatin  20 MG tablet Commonly known as: LIPITOR TAKE 1 TABLET(20 MG) BY MOUTH DAILY   EPINEPHrine  0.3 mg/0.3 mL Soaj injection Commonly known as: EPI-PEN Inject 0.3 mg into the muscle as needed for anaphylaxis.   fluticasone -salmeterol 100-50 MCG/ACT Aepb Commonly known as: Wixela Inhub Inhale 1 puff into the lungs 2 (two) times  daily.        Allergies:  Allergies  Allergen Reactions   Bee Venom Anaphylaxis, Hives, Swelling and Other (See Comments)    Wasp, yellow jackets, hornets    Family History: Family History  Problem Relation Age of Onset   Hyperlipidemia Mother    Hypertension Father    Heart disease Father    Hyperlipidemia Father    Cancer Paternal Grandmother    Heart disease Paternal Grandmother    Cancer Sister 48       died bladder and vaginal cancer    Social History:  reports that she has never smoked. She has never used smokeless tobacco. She reports that she does not drink alcohol and does not use drugs.  ROS: All other review of systems were reviewed and are negative except what is noted above in HPI  Physical Exam: BP 139/84   Pulse 73   Constitutional:  Alert and oriented, No acute distress. HEENT: [place] AT, moist mucus membranes.  Trachea midline, no masses. Cardiovascular: No clubbing, cyanosis, or edema. Respiratory: Normal respiratory effort, no increased work of breathing. GI: Abdomen is soft, nontender, nondistended, no abdominal masses GU: No CVA tenderness.  Lymph: No cervical or inguinal lymphadenopathy. Skin: No rashes, bruises or suspicious lesions. Neurologic: Grossly intact, no focal deficits, moving all 4 extremities. Psychiatric: Normal mood and affect.  Laboratory Data: Lab Results  Component Value Date   WBC 7.6 03/07/2024   HGB 14.5 03/07/2024   HCT 44.0 03/07/2024   MCV 91 03/07/2024   PLT 321 03/07/2024    Lab Results  Component Value Date   CREATININE 1.03 (H) 03/07/2024    No results found for: PSA  No results found for: TESTOSTERONE  Lab Results  Component Value Date   HGBA1C 5.4 04/26/2021    Urinalysis No results found for: COLORURINE, APPEARANCEUR, LABSPEC, PHURINE, GLUCOSEU, HGBUR, BILIRUBINUR, KETONESUR, PROTEINUR, UROBILINOGEN, NITRITE, LEUKOCYTESUR  No results found for: LABMICR, WBCUA,  RBCUA, LABEPIT, MUCUS, BACTERIA  Pertinent Imaging: CT 03/08/24: Images reviewed and discussed with the patient  No results found for this or any previous visit.  No results found for this or any previous visit.  No results found for this or any previous visit.  No results found for this or any previous visit.  No results found for this or any previous visit.  No results found for this or any previous visit.  No results found for this or any previous visit.  No results found for this or any previous visit.   Assessment & Plan:    1. Renal mass (Primary) We discussed the natural hx of renal masses and the 80/20 malignant/benign likelihood. We disucssed the treatment options including active surveillance. Renal ablation, partial and radical nephrectomy.  - Urinalysis, Routine w reflex microscopic   No follow-ups on file.  Belvie Clara, MD  [facility] [place]

## 2024-03-18 ENCOUNTER — Telehealth: Payer: Self-pay | Admitting: Urology

## 2024-03-18 NOTE — Telephone Encounter (Signed)
 Return call to pt making her aware Radiology has not read report yet and it can take up to two weeks. Pt is made aware that soon as we receive the chest xray we will routed it to the MD, McKenzie. Verbalized understanding.

## 2024-03-18 NOTE — Telephone Encounter (Signed)
 Patient returned call requesting XRAY results.   Test was completed on 10/1.   Best number to contact patient [phone] Call transferred to clinical basket.

## 2024-03-29 ENCOUNTER — Other Ambulatory Visit (HOSPITAL_COMMUNITY)

## 2024-03-29 NOTE — Patient Instructions (Signed)
 Your procedure is scheduled on April 04, 2024.  Report to [facility] Main Entrance at 8:30 A.M.   Call this number if you have problems the morning of surgery:  [phone]  If you experience any cold or flu symptoms such as cough, fever, chills, shortness of breath, etc. between now and your scheduled surgery, please notify us  at the above number.   Remember:  Do not eat or drink after midnight.    Take these medicines the morning of surgery with A SIP OF WATER  -none   Use inhaler if needed morning of surgery-please bring this with you.     Do not wear jewelry, make-up or nail polish, including gel polish,  artificial nails, or any other type of covering on natural nails.  Do not wear lotions, powders, or perfumes, or deodorant.  Do not shave 48 hours prior to surgery.   Do not bring valuables to the hospital.  [facility] is not responsible for any belongings or valuables.  Contacts, dentures or bridgework may not be worn into surgery.  Leave your suitcase in the car.  After surgery it may be brought to your room.  For patients admitted to the hospital, discharge time will be determined by your treatment team.  Patients discharged the day of surgery will not be allowed to drive home and must have someone be with them for 24 hours.   Special instructions: DO NOT SMOKE TOBACCO OR VAPE 24 HOURS PRIOR TO YOUR PROCEDURE.   Please brush your teeth morning of procedure.   Please read over the following: Anesthesia Post-op Instructions and Care and Recovery After Surgery  How to Use Chlorhexidine  at Home in the Shower Chlorhexidine  gluconate (CHG) is a germ-killing (antiseptic) wash that's used to clean the skin. It can get rid of the germs that normally live on the skin and can keep them away for about 24 hours. If you're having surgery, you may be told to shower with CHG at home the night before surgery. This can help lower your risk for infection. To use CHG wash  in the shower, follow the steps below. Supplies needed: CHG body wash. Clean washcloth. Clean towel. How to use CHG in the shower Follow these steps unless you're told to use CHG in a different way: Start the shower. Use your normal soap and shampoo to wash your face and hair. Turn off the shower or move out of the shower stream. Pour CHG onto a clean washcloth. Do not use any type of brush or rough sponge. Start at your neck, washing your body down to your toes. Make sure you: Wash the part of your body where the surgery will be done for at least 1 minute. Do not scrub. Do not use CHG on your head or face unless your health care provider tells you to. If it gets into your ears or eyes, rinse them well with water . Do not wash your genitals with CHG. Wash your back and under your arms. Make sure to wash skin folds. Let the CHG sit on your skin for 1-2 minutes or as long as told. Rinse your entire body in the shower, including all body creases and folds. Turn off the shower. Dry off with a clean towel. Do not put anything on your skin afterward, such as powder, lotion, or perfume. Put on clean clothes or pajamas. If it's the night before surgery, sleep in clean sheets. General tips Use CHG only as told, and follow the instructions on  the label. Use the full amount of CHG as told. This is often one bottle. Do not smoke and stay away from flames after using CHG. Your skin may feel sticky after using CHG. This is normal. The sticky feeling will go away as the CHG dries. Do not use CHG: If you have a chlorhexidine  allergy or have reacted to chlorhexidine  in the past. On open wounds or areas of skin that have broken skin, cuts, or scrapes. On babies younger than 68 months of age. Contact a health care provider if: You have questions about using CHG. Your skin gets irritated or itchy. You have a rash after using CHG. You swallow any CHG. Call your local poison control center [phone]  in the [place] Your eyes itch badly, or they become very red or swollen. Your hearing changes. You have trouble seeing. If you can't reach your provider, go to an urgent care or emergency room. Do not drive yourself. Get help right away if: You have swelling or tingling in your mouth or throat. You make high-pitched whistling sounds when you breathe, most often when you breathe out (wheeze). You have trouble breathing. These symptoms may be an emergency. Call 911 right away. Do not wait to see if the symptoms will go away. Do not drive yourself to the hospital. This information is not intended to replace advice given to you by your health care provider. Make sure you discuss any questions you have with your health care provider. Document Revised: 12/16/2022 Document Reviewed: 12/12/2021 Elsevier Patient Education  2024 [employer]  Minimally Invasive Nephrectomy Minimally invasive nephrectomy is a surgery to remove a kidney. The body has two kidneys. They work as a filter to clean the blood and remove waste. The kidneys also remove extra fluid from the body by making pee (urine). This surgery may be done to: Remove the entire kidney, adrenal gland, and some structures around them. This is called a radical nephrectomy. Remove only the damaged or diseased part of the kidney. This is called a partial nephrectomy. You may need this surgery if: Your kidney is badly damaged from disease, infection, injury, or cancer. You were born with an abnormal kidney. You are donating a healthy kidney. Tell a health care provider about: Any allergies you have. All medicines you take. These include vitamins, herbs, eye drops, and creams. Any problems you or family members have had with anesthesia. Any bleeding problems you have. Any surgeries you've had. Any medical conditions you have. Whether you're pregnant or may be pregnant. What are the risks? Your health care provider will talk with you about  risks. These may include: Bleeding. Infection. Damage to other body structures near the kidney. Pulmonary embolism. This is when a blood clot forms in the leg and moves to the lung. Allergic reactions to medicines. Kidney failure. Changing to an open procedure. This may happen if a bone, usually part of a rib, needs to be removed so the surgeon can get to the kidney. What happens before the surgery? When to stop eating and drinking Eat and drink only as you've been told. You may be told this: 8 hours before your surgery Stop eating most foods. Do not eat meat, fried foods, or fatty foods. Eat only light foods, such as toast or crackers. All liquids are okay except energy drinks and alcohol. 6 hours before your surgery Stop eating. Drink only clear liquids, such as water , clear fruit juice, black coffee, plain tea, and sports drinks. Do not drink energy  drinks or alcohol. 2 hours before your surgery Stop drinking all liquids. You may be allowed to take medicines with small sips of water . If you do not eat and drink as told, your surgery may be delayed or canceled. Medicines Ask about changing or stopping: Any medicines you take. Any vitamins, herbs, or supplements you take. Do not take aspirin  or ibuprofen unless you're told to. Surgery safety For your safety, you may: Need to wash your skin with a soap that kills germs. Get antibiotics. Have your surgery site marked. Have hair removed at the surgery site. General instructions Do not smoke, vape, or use nicotine or tobacco for at least 4 weeks before the surgery. You might have your blood drawn and tested in case you need to receive blood, or get a transfusion, during surgery. What happens during the surgery?     An IV will be put into a vein in your hand or arm. You may be given: A sedative to help you relax. Anesthesia to keep you from feeling pain. A soft tube called a catheter will be placed in your bladder to drain  pee. Several small cuts will be made in your belly. Special tools called trocars will be inserted through these cuts. Your belly will be filled with a gas. This gives the surgeon more room to work. A laparoscope will be put through one of the trocars. This is a thin, lighted tube with a camera on the end. The camera will send images to a screen in the operating room. Surgical tools will be put through the other cuts. Sometimes, the surgeon may control these tools using a robot. This is called a robot-assisted nephrectomy. If you're having a partial nephrectomy: The blood vessels attached to your kidney will be clamped. Part of your kidney will be removed. The kidney will be closed with stitches or staples, and the clamp on the blood vessels will be removed. If you're having a radical nephrectomy: All of the blood vessels that attach to your kidney will be tied off and separated from the kidney. Part of your ureter will be removed. The ureter is the tube that carries pee from your kidney to your bladder. A larger cut may be made in your lower belly to take out the kidney. A small tube (drain) may be placed near one of the cuts to drain extra fluid. The cuts will be closed with stitches, staples, skin glue, or tape strips. A bandage will be placed over the area. These steps may vary. Ask what you can expect. What happens after the surgery? You'll be watched closely until you leave. This includes checking your pain level, blood pressure, heart rate, and breathing rate. You may have: A urinary catheter. How much you pee will be checked. A drain. Your surgical drain will be removed after a few days. Pain medicine given through your IV. Compression stockings to reduce swelling and help prevent blood clots in your legs. You'll be told to: Get out of bed and walk as soon as you can. Do breathing exercises, like coughing and breathing deeply. This helps prevent pneumonia. Where to find more  information [employer] Society: cancer.org This information is not intended to replace advice given to you by your health care provider. Make sure you discuss any questions you have with your health care provider. Document Revised: 11/19/2022 Document Reviewed: 11/19/2022 Elsevier Patient Education  2024 [employer].  Minimally Invasive Nephrectomy, Care After After a minimally invasive nephrectomy, it's common to have  some pain. You may also have soreness and numbness in the area around your cuts from surgery. Follow these instructions at home: Medicines Take your medicines only as told. Avoid using NSAIDs regularly. These include aspirin  and ibuprofen. Using them often may damage your remaining kidney. You may need to take steps to help treat or prevent trouble pooping (constipation), such as: Taking medicines to help you poop. Eating foods high in fiber, like beans, whole grains, and fresh fruits and vegetables. Drinking more fluids as told. Ask your health care provider if it's safe to drive or use machines while taking your medicine. Incision care and drain care  Take care of your cuts from surgery as told. Make sure you: Wash your hands with soap and water  for at least 20 seconds before and after you change your bandage. If you can't use soap and water , use hand sanitizer. Change your bandage. Leave stitches or skin glue alone. Leave tape strips alone unless you're told to take them off. You may trim the edges of the tape strips if they curl up. Check the area around your cuts from surgery every day for signs of infection. Check for: Redness, swelling, or more pain. Fluid or blood. Warmth. Pus or a bad smell. If you have a drain, follow your provider's instructions for drain care. This may include emptying the drain and keeping track of the amount of drainage. Activity Rest as told. Get up to take short walks at least every 2 hours during the day. This helps you breathe  better and keeps your blood flowing. Ask for help if you feel weak or unsteady. Ask if it's OK for you to lift. Do not play contact sports. This may damage your remaining kidney. Ask what things are safe for you to do at home. Ask when you can go back to work or school. Catheter care If you have a urinary catheter, care for it as told by your provider. You may be told to: Wash your hands with soap and water  for at least 20 seconds before and after touching the catheter, tubing, or drainage bag. If you can't use soap and water , use hand sanitizer. Empty the drainage bag every 2-4 hours, or more often if needed. Do not let the bag get completely full. Monitor the amount and color of your pee as told. Keep the area where the catheter enters your body clean and dry. Make sure to keep the catheter secured to avoid pulling and removing it by accident. Always keep the pee collection bag below the level of your bladder. Check the catheter tubing often to make sure there are no kinks or blockages. General instructions Do not take baths, swim, or use a hot tub until you're told it's OK. Ask if you can shower. Do deep breathing exercises as told by your provider. These help to prevent lung infection (pneumonia). Wear compression stockings to reduce swelling and help prevent blood clots in your legs. Do not smoke, vape, or use nicotine or tobacco. Doing this can slow down healing. Keep all follow-up visits. Your provider will make sure the remaining kidney is healthy and that you're healing. Your provider may give you more instructions. Make sure you know what you can and can't do. Contact a health care provider if: You have a fever or chills. You have any signs of infection. You have pain that's not controlled by your pain medicine. Your amount of pee decreases a lot. You have blood in your pee. You haven't pooped  in 3 days. Get help right away if: You have trouble breathing or feel short of  breath. You have chest pain. You have severe pain. You cannot pee. You have warmth, redness, and tenderness in your leg. These symptoms may be an emergency. Call 911 right away. Do not wait to see if the symptoms will go away. Do not drive yourself to the hospital. This information is not intended to replace advice given to you by your health care provider. Make sure you discuss any questions you have with your health care provider. Document Revised: 11/19/2022 Document Reviewed: 11/19/2022 Elsevier Patient Education  2024 [employer] Inc.  General Anesthesia, Adult, Care After The following information offers guidance on how to care for yourself after your procedure. Your health care provider may also give you more specific instructions. If you have problems or questions, contact your health care provider. What can I expect after the procedure? After the procedure, it is common for people to: Have pain or discomfort at the IV site. Have nausea or vomiting. Have a sore throat or hoarseness. Have trouble concentrating. Feel cold or chills. Feel weak, sleepy, or tired (fatigue). Have soreness and body aches. These can affect parts of the body that were not involved in surgery. Follow these instructions at home: For the time period you were told by your health care provider:  Rest. Do not participate in activities where you could fall or become injured. Do not drive or use machinery. Do not drink alcohol. Do not take sleeping pills or medicines that cause drowsiness. Do not make important decisions or sign legal documents. Do not take care of children on your own. General instructions Drink enough fluid to keep your urine pale yellow. If you have sleep apnea, surgery and certain medicines can increase your risk for breathing problems. Follow instructions from your health care provider about wearing your sleep device: Anytime you are sleeping, including during daytime naps. While  taking prescription pain medicines, sleeping medicines, or medicines that make you drowsy. Return to your normal activities as told by your health care provider. Ask your health care provider what activities are safe for you. Take over-the-counter and prescription medicines only as told by your health care provider. Do not use any products that contain nicotine or tobacco. These products include cigarettes, chewing tobacco, and vaping devices, such as e-cigarettes. These can delay incision healing after surgery. If you need help quitting, ask your health care provider. Contact a health care provider if: You have nausea or vomiting that does not get better with medicine. You vomit every time you eat or drink. You have pain that does not get better with medicine. You cannot urinate or have bloody urine. You develop a skin rash. You have a fever. Get help right away if: You have trouble breathing. You have chest pain. You vomit blood. These symptoms may be an emergency. Get help right away. Call 911. Do not wait to see if the symptoms will go away. Do not drive yourself to the hospital. Summary After the procedure, it is common to have a sore throat, hoarseness, nausea, vomiting, or to feel weak, sleepy, or fatigue. For the time period you were told by your health care provider, do not drive or use machinery. Get help right away if you have difficulty breathing, have chest pain, or vomit blood. These symptoms may be an emergency. This information is not intended to replace advice given to you by your health care provider. Make sure you discuss any  questions you have with your health care provider. Document Revised: 08/30/2021 Document Reviewed: 08/30/2021 Elsevier Patient Education  2024 [employer].

## 2024-03-31 ENCOUNTER — Other Ambulatory Visit: Payer: Self-pay

## 2024-03-31 ENCOUNTER — Encounter (HOSPITAL_COMMUNITY): Payer: Self-pay

## 2024-03-31 ENCOUNTER — Encounter (HOSPITAL_COMMUNITY)
Admission: RE | Admit: 2024-03-31 | Discharge: 2024-03-31 | Disposition: A | Discharge status: Home / Self Care | Source: Ambulatory Visit | Attending: Urology | Admitting: Urology

## 2024-03-31 DIAGNOSIS — N2889 Other specified disorders of kidney and ureter: Secondary | ICD-10-CM | POA: Diagnosis present

## 2024-03-31 DIAGNOSIS — Z01812 Encounter for preprocedural laboratory examination: Secondary | ICD-10-CM | POA: Diagnosis present

## 2024-03-31 LAB — CBC
HCT: 41.2 % (ref 36.0–46.0)
Hemoglobin: 13.9 g/dL (ref 12.0–15.0)
MCH: 30.5 pg (ref 26.0–34.0)
MCHC: 33.7 g/dL (ref 30.0–36.0)
MCV: 90.5 fL (ref 80.0–100.0)
Platelets: 294 K/uL (ref 150–400)
RBC: 4.55 MIL/uL (ref 3.87–5.11)
RDW: 12.3 % (ref 11.5–15.5)
WBC: 7.2 K/uL (ref 4.0–10.5)
nRBC: 0 % (ref 0.0–0.2)

## 2024-03-31 LAB — TYPE AND SCREEN
ABO/RH(D): O POS
Antibody Screen: NEGATIVE

## 2024-04-04 ENCOUNTER — Inpatient Hospital Stay (HOSPITAL_COMMUNITY): Payer: Self-pay

## 2024-04-04 ENCOUNTER — Other Ambulatory Visit: Payer: Self-pay

## 2024-04-04 ENCOUNTER — Inpatient Hospital Stay (HOSPITAL_COMMUNITY)
Admission: RE | Admit: 2024-04-04 | Discharge: 2024-04-06 | DRG: 658 | Disposition: A | Discharge status: Home / Self Care | Attending: Urology | Admitting: Urology

## 2024-04-04 ENCOUNTER — Encounter (HOSPITAL_COMMUNITY)
Admission: RE | Disposition: A | Discharge status: Home / Self Care | Payer: Self-pay | Source: Home / Self Care | Attending: Urology

## 2024-04-04 ENCOUNTER — Encounter (HOSPITAL_COMMUNITY): Payer: Self-pay | Admitting: Urology

## 2024-04-04 DIAGNOSIS — Z83438 Family history of other disorder of lipoprotein metabolism and other lipidemia: Secondary | ICD-10-CM

## 2024-04-04 DIAGNOSIS — Z91030 Bee allergy status: Secondary | ICD-10-CM | POA: Diagnosis not present

## 2024-04-04 DIAGNOSIS — Z90710 Acquired absence of both cervix and uterus: Secondary | ICD-10-CM | POA: Diagnosis not present

## 2024-04-04 DIAGNOSIS — J45909 Unspecified asthma, uncomplicated: Secondary | ICD-10-CM | POA: Diagnosis present

## 2024-04-04 DIAGNOSIS — E7800 Pure hypercholesterolemia, unspecified: Secondary | ICD-10-CM | POA: Diagnosis present

## 2024-04-04 DIAGNOSIS — K660 Peritoneal adhesions (postprocedural) (postinfection): Secondary | ICD-10-CM | POA: Diagnosis present

## 2024-04-04 DIAGNOSIS — Z87892 Personal history of anaphylaxis: Secondary | ICD-10-CM | POA: Diagnosis not present

## 2024-04-04 DIAGNOSIS — Z8249 Family history of ischemic heart disease and other diseases of the circulatory system: Secondary | ICD-10-CM

## 2024-04-04 DIAGNOSIS — Z9109 Other allergy status, other than to drugs and biological substances: Secondary | ICD-10-CM

## 2024-04-04 DIAGNOSIS — C642 Malignant neoplasm of left kidney, except renal pelvis: Principal | ICD-10-CM

## 2024-04-04 DIAGNOSIS — K219 Gastro-esophageal reflux disease without esophagitis: Secondary | ICD-10-CM | POA: Diagnosis present

## 2024-04-04 DIAGNOSIS — N2889 Other specified disorders of kidney and ureter: Principal | ICD-10-CM

## 2024-04-04 HISTORY — PX: ROBOT ASSISTED LAPAROSCOPIC NEPHRECTOMY: SHX5140

## 2024-04-04 LAB — BASIC METABOLIC PANEL WITH GFR
Anion gap: 10 (ref 5–15)
BUN: 10 mg/dL (ref 6–20)
CO2: 26 mmol/L (ref 22–32)
Calcium: 9.3 mg/dL (ref 8.9–10.3)
Chloride: 101 mmol/L (ref 98–111)
Creatinine, Ser: 1.03 mg/dL — ABNORMAL HIGH (ref 0.44–1.00)
GFR, Estimated: >60 mL/min (ref >60)
Glucose, Bld: 135 mg/dL — ABNORMAL HIGH (ref 70–99)
Potassium: 4.6 mmol/L (ref 3.5–5.1)
Sodium: 137 mmol/L (ref 135–145)

## 2024-04-04 LAB — CBC
HCT: 41.5 % (ref 36.0–46.0)
Hemoglobin: 13.4 g/dL (ref 12.0–15.0)
MCH: 29.8 pg (ref 26.0–34.0)
MCHC: 32.3 g/dL (ref 30.0–36.0)
MCV: 92.2 fL (ref 80.0–100.0)
Platelets: 256 K/uL (ref 150–400)
RBC: 4.5 MIL/uL (ref 3.87–5.11)
RDW: 12.2 % (ref 11.5–15.5)
WBC: 9.3 K/uL (ref 4.0–10.5)
nRBC: 0 % (ref 0.0–0.2)

## 2024-04-04 LAB — ABO/RH: ABO/RH(D): O POS

## 2024-04-04 SURGERY — NEPHRECTOMY, RADICAL, ROBOT-ASSISTED, LAPAROSCOPIC, ADULT
Anesthesia: General | Site: Abdomen | Laterality: Left

## 2024-04-04 MED ORDER — FENTANYL CITRATE (PF) 50 MCG/ML IJ SOSY
25.0000 ug | PREFILLED_SYRINGE | INTRAMUSCULAR | Status: DC | PRN
  Administered 2024-04-04 (×2): 50 ug via INTRAVENOUS
  Filled 2024-04-04: qty 1

## 2024-04-04 MED ORDER — ACETAMINOPHEN 325 MG PO TABS: 650.0000 mg | ORAL_TABLET | ORAL | Status: DC | PRN

## 2024-04-04 MED ORDER — HYDROMORPHONE HCL 1 MG/ML IJ SOLN
INTRAMUSCULAR | Status: AC
  Filled 2024-04-04: qty 0.5

## 2024-04-04 MED ORDER — ACETAMINOPHEN 10 MG/ML IV SOLN
INTRAVENOUS | Status: AC
  Filled 2024-04-04: qty 100

## 2024-04-04 MED ORDER — OXYCODONE-ACETAMINOPHEN 5-325 MG PO TABS
1.0000 | ORAL_TABLET | ORAL | Status: DC | PRN
  Administered 2024-04-04 – 2024-04-06 (×6): 2 via ORAL
  Administered 2024-04-06: 1 via ORAL
  Filled 2024-04-04: qty 1
  Filled 2024-04-04 (×6): qty 2

## 2024-04-04 MED ORDER — FENTANYL CITRATE (PF) 250 MCG/5ML IJ SOLN
INTRAMUSCULAR | Status: DC | PRN
  Administered 2024-04-04 (×5): 50 ug via INTRAVENOUS

## 2024-04-04 MED ORDER — PROPOFOL 500 MG/50ML IV EMUL
INTRAVENOUS | Status: AC
  Filled 2024-04-04: qty 200

## 2024-04-04 MED ORDER — STERILE WATER FOR IRRIGATION IR SOLN
Status: DC | PRN
  Administered 2024-04-04: 500 mL

## 2024-04-04 MED ORDER — ACETAMINOPHEN 10 MG/ML IV SOLN
INTRAVENOUS | Status: DC | PRN
  Administered 2024-04-04: 1000 mg via INTRAVENOUS

## 2024-04-04 MED ORDER — ONDANSETRON HCL 4 MG/2ML IJ SOLN
4.0000 mg | INTRAMUSCULAR | Status: DC | PRN
  Administered 2024-04-04: 4 mg via INTRAVENOUS
  Filled 2024-04-04: qty 2

## 2024-04-04 MED ORDER — ZOLPIDEM TARTRATE 5 MG PO TABS
5.0000 mg | ORAL_TABLET | Freq: Every evening | ORAL | Status: DC | PRN
  Administered 2024-04-04 – 2024-04-05 (×2): 5 mg via ORAL
  Filled 2024-04-04 (×2): qty 1

## 2024-04-04 MED ORDER — VASOPRESSIN 20 UNIT/ML IV SOLN
INTRAVENOUS | Status: AC
  Filled 2024-04-04: qty 1

## 2024-04-04 MED ORDER — SUCCINYLCHOLINE CHLORIDE 200 MG/10ML IV SOSY
PREFILLED_SYRINGE | INTRAVENOUS | Status: AC
  Filled 2024-04-04: qty 10

## 2024-04-04 MED ORDER — LACTATED RINGERS IV SOLN
INTRAVENOUS | Status: DC
  Administered 2024-04-04: 1000 mL via INTRAVENOUS

## 2024-04-04 MED ORDER — MIDAZOLAM HCL 5 MG/5ML IJ SOLN
INTRAMUSCULAR | Status: DC | PRN
  Administered 2024-04-04 (×2): 1 mg via INTRAVENOUS

## 2024-04-04 MED ORDER — LIDOCAINE 2% (20 MG/ML) 5 ML SYRINGE
INTRAMUSCULAR | Status: AC
  Filled 2024-04-04: qty 5

## 2024-04-04 MED ORDER — OXYBUTYNIN CHLORIDE 5 MG PO TABS: 5.0000 mg | ORAL_TABLET | Freq: Three times a day (TID) | ORAL | Status: DC | PRN

## 2024-04-04 MED ORDER — CEFAZOLIN SODIUM-DEXTROSE 2-4 GM/100ML-% IV SOLN
INTRAVENOUS | Status: AC
  Filled 2024-04-04: qty 100

## 2024-04-04 MED ORDER — ORAL CARE MOUTH RINSE: 15.0000 mL | OROMUCOSAL | Status: DC | PRN

## 2024-04-04 MED ORDER — HEMOSTATIC AGENTS (NO CHARGE) OPTIME
TOPICAL | Status: DC | PRN
  Administered 2024-04-04: 1 via TOPICAL

## 2024-04-04 MED ORDER — ONDANSETRON HCL 4 MG/2ML IJ SOLN
INTRAMUSCULAR | Status: AC
  Filled 2024-04-04: qty 4

## 2024-04-04 MED ORDER — BUPIVACAINE HCL (PF) 0.5 % IJ SOLN
INTRAMUSCULAR | Status: DC | PRN
  Administered 2024-04-04: 20 mL

## 2024-04-04 MED ORDER — BUPIVACAINE HCL (PF) 0.25 % IJ SOLN
INTRAMUSCULAR | Status: AC
  Filled 2024-04-04: qty 30

## 2024-04-04 MED ORDER — CEFAZOLIN SODIUM-DEXTROSE 2-4 GM/100ML-% IV SOLN
2.0000 g | Freq: Three times a day (TID) | INTRAVENOUS | Status: AC
  Administered 2024-04-04 – 2024-04-05 (×2): 2 g via INTRAVENOUS
  Filled 2024-04-04 (×2): qty 100

## 2024-04-04 MED ORDER — ONDANSETRON HCL 4 MG/2ML IJ SOLN
INTRAMUSCULAR | Status: DC | PRN
  Administered 2024-04-04: 4 mg via INTRAVENOUS

## 2024-04-04 MED ORDER — HYDROMORPHONE HCL 1 MG/ML IJ SOLN
0.5000 mg | INTRAMUSCULAR | Status: DC | PRN
  Administered 2024-04-04 (×2): 1 mg via INTRAVENOUS
  Filled 2024-04-04 (×2): qty 1

## 2024-04-04 MED ORDER — HYDROMORPHONE HCL 1 MG/ML IJ SOLN
INTRAMUSCULAR | Status: DC | PRN
  Administered 2024-04-04: .5 mg via INTRAVENOUS

## 2024-04-04 MED ORDER — SENNOSIDES-DOCUSATE SODIUM 8.6-50 MG PO TABS
2.0000 | ORAL_TABLET | Freq: Every day | ORAL | Status: DC
  Administered 2024-04-04 – 2024-04-05 (×2): 2 via ORAL
  Filled 2024-04-04 (×2): qty 2

## 2024-04-04 MED ORDER — DIPHENHYDRAMINE HCL 12.5 MG/5ML PO ELIX: 12.5000 mg | ORAL_SOLUTION | Freq: Four times a day (QID) | ORAL | Status: DC | PRN

## 2024-04-04 MED ORDER — CHLORHEXIDINE GLUCONATE 0.12 % MT SOLN
15.0000 mL | Freq: Once | OROMUCOSAL | Status: AC
Start: 2024-04-04 — End: 2024-04-04
  Administered 2024-04-04: 15 mL via OROMUCOSAL

## 2024-04-04 MED ORDER — DEXAMETHASONE SOD PHOSPHATE PF 10 MG/ML IJ SOLN
INTRAMUSCULAR | Status: DC | PRN
Start: 2024-04-04 — End: 2024-04-04
  Administered 2024-04-04: 10 mg via INTRAVENOUS

## 2024-04-04 MED ORDER — ROCURONIUM BROMIDE 10 MG/ML (PF) SYRINGE
PREFILLED_SYRINGE | INTRAVENOUS | Status: AC
  Filled 2024-04-04: qty 20

## 2024-04-04 MED ORDER — SUGAMMADEX SODIUM 200 MG/2ML IV SOLN
INTRAVENOUS | Status: DC | PRN
  Administered 2024-04-04: 300 mg via INTRAVENOUS

## 2024-04-04 MED ORDER — CHLORHEXIDINE GLUCONATE CLOTH 2 % EX PADS
6.0000 | MEDICATED_PAD | Freq: Every day | CUTANEOUS | Status: DC
  Administered 2024-04-04 – 2024-04-06 (×3): 6 via TOPICAL

## 2024-04-04 MED ORDER — FENTANYL CITRATE (PF) 250 MCG/5ML IJ SOLN
INTRAMUSCULAR | Status: AC
  Filled 2024-04-04: qty 5

## 2024-04-04 MED ORDER — DIPHENHYDRAMINE HCL 50 MG/ML IJ SOLN
12.5000 mg | Freq: Four times a day (QID) | INTRAMUSCULAR | Status: DC | PRN
  Administered 2024-04-04: 25 mg via INTRAVENOUS
  Filled 2024-04-04: qty 1

## 2024-04-04 MED ORDER — SODIUM CHLORIDE 0.9 % IV SOLN: INTRAVENOUS | Status: AC

## 2024-04-04 MED ORDER — ROCURONIUM BROMIDE 10 MG/ML (PF) SYRINGE
PREFILLED_SYRINGE | INTRAVENOUS | Status: DC | PRN
  Administered 2024-04-04: 20 mg via INTRAVENOUS
  Administered 2024-04-04: 70 mg via INTRAVENOUS

## 2024-04-04 MED ORDER — ATORVASTATIN CALCIUM 20 MG PO TABS
20.0000 mg | ORAL_TABLET | Freq: Every day | ORAL | Status: DC
  Administered 2024-04-04 – 2024-04-06 (×3): 20 mg via ORAL
  Filled 2024-04-04 (×3): qty 1

## 2024-04-04 MED ORDER — FENTANYL CITRATE (PF) 50 MCG/ML IJ SOSY
PREFILLED_SYRINGE | INTRAMUSCULAR | Status: AC
  Filled 2024-04-04: qty 1

## 2024-04-04 MED ORDER — ALBUTEROL SULFATE (2.5 MG/3ML) 0.083% IN NEBU: 2.5000 mg | INHALATION_SOLUTION | Freq: Four times a day (QID) | RESPIRATORY_TRACT | Status: DC | PRN

## 2024-04-04 MED ORDER — OXYCODONE HCL 5 MG/5ML PO SOLN: 5.0000 mg | Freq: Once | ORAL | Status: DC | PRN

## 2024-04-04 MED ORDER — CEFAZOLIN SODIUM-DEXTROSE 2-4 GM/100ML-% IV SOLN
2.0000 g | INTRAVENOUS | Status: AC
  Administered 2024-04-04: 2 g via INTRAVENOUS

## 2024-04-04 MED ORDER — ORAL CARE MOUTH RINSE: 15.0000 mL | Freq: Once | OROMUCOSAL | Status: AC

## 2024-04-04 MED ORDER — OXYCODONE HCL 5 MG PO TABS: 5.0000 mg | ORAL_TABLET | Freq: Once | ORAL | Status: DC | PRN

## 2024-04-04 MED ORDER — PROPOFOL 500 MG/50ML IV EMUL
INTRAVENOUS | Status: DC | PRN
  Administered 2024-04-04: 25 ug/kg/min via INTRAVENOUS
  Administered 2024-04-04: 40 mg via INTRAVENOUS
  Administered 2024-04-04: 140 mg via INTRAVENOUS

## 2024-04-04 MED ORDER — MIDAZOLAM HCL 2 MG/2ML IJ SOLN
INTRAMUSCULAR | Status: AC
  Filled 2024-04-04: qty 2

## 2024-04-04 MED ORDER — DEXMEDETOMIDINE HCL IN NACL 80 MCG/20ML IV SOLN
INTRAVENOUS | Status: DC | PRN
  Administered 2024-04-04: 8 ug via INTRAVENOUS

## 2024-04-04 MED ORDER — LIDOCAINE 2% (20 MG/ML) 5 ML SYRINGE
INTRAMUSCULAR | Status: DC | PRN
  Administered 2024-04-04: 40 mg via INTRAVENOUS

## 2024-04-04 MED ORDER — ONDANSETRON HCL 4 MG/2ML IJ SOLN: 4.0000 mg | Freq: Once | INTRAMUSCULAR | Status: DC | PRN

## 2024-04-04 SURGICAL SUPPLY — 50 items
BLADE SURG 15 STRL LF DISP TIS (BLADE) ×1 IMPLANT
CHLORAPREP W/TINT 26 (MISCELLANEOUS) ×1 IMPLANT
CLIP LIGATING HEM O LOK PURPLE (MISCELLANEOUS) ×2 IMPLANT
COVER LIGHT HANDLE STERIS (MISCELLANEOUS) ×1 IMPLANT
COVER MAYO STAND XLG (MISCELLANEOUS) ×1 IMPLANT
COVER TIP SHEARS 8 DVNC (MISCELLANEOUS) ×1 IMPLANT
DERMABOND ADVANCED .7 DNX12 (GAUZE/BANDAGES/DRESSINGS) ×2 IMPLANT
DRAPE ARM DVNC X/XI (DISPOSABLE) ×4 IMPLANT
DRAPE COLUMN DVNC XI (DISPOSABLE) ×1 IMPLANT
DRAPE HALF SHEET 40X57 (DRAPES) ×1 IMPLANT
DRAPE INCISE IOBAN 66X45 STRL (DRAPES) ×1 IMPLANT
DRSG IV TEGADERM 3.5X4.5 STRL (GAUZE/BANDAGES/DRESSINGS) ×1 IMPLANT
DRSG TEGADERM 2-3/8X2-3/4 SM (GAUZE/BANDAGES/DRESSINGS) ×3 IMPLANT
DRSG TEGADERM 4X4.75 (GAUZE/BANDAGES/DRESSINGS) IMPLANT
ELECT PENCIL ROCKER SW 15FT (MISCELLANEOUS) ×1 IMPLANT
ELECT REM PT RETURN 15FT ADLT (MISCELLANEOUS) ×1 IMPLANT
FORCEPS BPLR FENES DVNC XI (FORCEP) ×1 IMPLANT
FORCEPS PROGRASP DVNC XI (FORCEP) ×1 IMPLANT
GAUZE SPONGE 2X2 STRL 8-PLY (GAUZE/BANDAGES/DRESSINGS) ×5 IMPLANT
GAUZE SPONGE 4X4 12PLY STRL (GAUZE/BANDAGES/DRESSINGS) IMPLANT
GLOVE BIO SURGEON STRL SZ8 (GLOVE) ×2 IMPLANT
GLOVE BIOGEL PI IND STRL 7.0 (GLOVE) ×4 IMPLANT
GLOVE BIOGEL PI IND STRL 8 (GLOVE) ×2 IMPLANT
GOWN STRL REUS W/TWL LRG LVL3 (GOWN DISPOSABLE) ×2 IMPLANT
GOWN STRL REUS W/TWL XL LVL3 (GOWN DISPOSABLE) ×2 IMPLANT
IRRIGATION SUCT STRKRFLW 2 WTP (MISCELLANEOUS) ×1 IMPLANT
KIT TURNOVER KIT A (KITS) IMPLANT
NDL HYPO 21X1.5 SAFETY (NEEDLE) ×1 IMPLANT
NDL INSUFFLATION 14GA 120MM (NEEDLE) ×1 IMPLANT
NEEDLE HYPO 21X1.5 SAFETY (NEEDLE) ×1 IMPLANT
NEEDLE INSUFFLATION 14GA 120MM (NEEDLE) ×1 IMPLANT
PACK LAP CHOLE LZT030E (CUSTOM PROCEDURE TRAY) ×1 IMPLANT
PAD ARMBOARD POSITIONER FOAM (MISCELLANEOUS) ×3 IMPLANT
POSITIONER HEAD 8X9X4 ADT (SOFTGOODS) ×1 IMPLANT
POWDER SURGICEL 3.0 GRAM (HEMOSTASIS) IMPLANT
RELOAD STAPLE 30 2.5 WHT DVNC (STAPLE) ×1 IMPLANT
SCISSORS MNPLR CVD DVNC XI (INSTRUMENTS) ×1 IMPLANT
SEAL UNIV 5-12 XI (MISCELLANEOUS) ×4 IMPLANT
SET BASIN LINEN APH (SET/KITS/TRAYS/PACK) ×1 IMPLANT
SET TUBE DA VINCI INSUFFLATOR (TUBING) IMPLANT
SOLN STERILE WATER BTL 1000 ML (IV SOLUTION) ×1 IMPLANT
SPONGE DRAIN TRACH 4X4 STRL 2S (GAUZE/BANDAGES/DRESSINGS) ×1 IMPLANT
STAPLER 30 CRVD 8 SUREFORM (STAPLE) ×1 IMPLANT
STAPLER VISISTAT (STAPLE) ×1 IMPLANT
SUT PDS AB 0 CTX 60 (SUTURE) ×1 IMPLANT
SUT VIC AB 2-0 SH 27X BRD (SUTURE) ×1 IMPLANT
SYR 20ML LL LF (SYRINGE) ×2 IMPLANT
TIP ENDOSCOPIC SURGICEL (TIP) IMPLANT
TRAY FOLEY MTR SLVR 16FR STAT (SET/KITS/TRAYS/PACK) ×1 IMPLANT
TROCAR Z-THAD FIOS HNDL 12X100 (TROCAR) IMPLANT

## 2024-04-04 NOTE — Anesthesia Preprocedure Evaluation (Signed)
 Anesthesia Evaluation  Patient identified by MRN, date of birth, ID band Patient awake    Reviewed: Allergy & Precautions, H&P , NPO status , Patient's Chart, lab work & pertinent test results, reviewed documented beta blocker date and time   Airway Mallampati: II  TM Distance: >3 FB Neck ROM: full    Dental no notable dental hx.    Pulmonary asthma    Pulmonary exam normal breath sounds clear to auscultation       Cardiovascular Exercise Tolerance: Good hypertension, negative cardio ROS  Rhythm:regular Rate:Normal     Neuro/Psych negative neurological ROS  negative psych ROS   GI/Hepatic Neg liver ROS,GERD  ,,  Endo/Other  negative endocrine ROS    Renal/GU negative Renal ROS  negative genitourinary   Musculoskeletal   Abdominal   Peds  Hematology negative hematology ROS (+)   Anesthesia Other Findings   Reproductive/Obstetrics negative OB ROS                              Anesthesia Physical Anesthesia Plan  ASA: 2  Anesthesia Plan: General and General ETT   Post-op Pain Management:    Induction:   PONV Risk Score and Plan: Ondansetron   Airway Management Planned:   Additional Equipment:   Intra-op Plan:   Post-operative Plan:   Informed Consent: I have reviewed the patients History and Physical, chart, labs and discussed the procedure including the risks, benefits and alternatives for the proposed anesthesia with the patient or authorized representative who has indicated his/her understanding and acceptance.     Dental Advisory Given  Plan Discussed with: CRNA  Anesthesia Plan Comments:         Anesthesia Quick Evaluation

## 2024-04-04 NOTE — Anesthesia Procedure Notes (Addendum)
 Procedure Name: Intubation Date/Time: 04/04/2024 12:02 PM  Performed by: Para Jerelene CROME, CRNAPre-anesthesia Checklist: Patient identified, Emergency Drugs available, Suction available and Patient being monitored Patient Re-evaluated:Patient Re-evaluated prior to induction Oxygen Delivery Method: Circle system utilized Preoxygenation: Pre-oxygenation with 100% oxygen Induction Type: IV induction Ventilation: Mask ventilation without difficulty Laryngoscope Size: Mac and 4 Grade View: Grade I Tube type: Oral Tube size: 7.0 mm Number of attempts: 2 Airway Equipment and Method: Stylet Placement Confirmation: ETT inserted through vocal cords under direct vision, positive ETCO2 and breath sounds checked- equal and bilateral Secured at: 21 cm Tube secured with: Tape Dental Injury: Teeth and Oropharynx as per pre-operative assessment  Comments: Direct Laryngoscopy x 2 by [place], SRNA. Atraumatic intubation By WENDI Para, CRNA. Positive blood suctioned in oropharynx after initial laryngoscopy prior to intubation. Teeth remain in preoperative condition.

## 2024-04-04 NOTE — Transfer of Care (Signed)
 Immediate Anesthesia Transfer of Care Note  Patient: Jane Baker  Procedure(s) Performed: NEPHRECTOMY AND ADRENALECTOMY , RADICAL, ROBOT-ASSISTED, LAPAROSCOPIC, ADULT (Left: Abdomen)  Patient Location: PACU  Anesthesia Type:General  Level of Consciousness: drowsy  Airway & Oxygen Therapy: Patient Spontanous Breathing and Patient connected to nasal cannula oxygen  Post-op Assessment: Report given to RN, Post -op Vital signs reviewed and stable, and Patient moving all extremities  Post vital signs: Reviewed and stable  Last Vitals:  Vitals Value Taken Time  BP 159/94 04/04/24 14:21  Temp 97.7 04/04/2024 1421  Pulse 70 04/04/24 14:23  Resp 14 04/04/24 14:23  SpO2 100 % 04/04/24 14:23  Vitals shown include unfiled device data.  Last Pain:  Vitals:   04/04/24 0936  TempSrc: Oral  PainSc: 0-No pain      Patients Stated Pain Goal: 5 (04/04/24 0936)  Complications: No notable events documented.

## 2024-04-04 NOTE — Op Note (Addendum)
 Preoperative diagnosis: Left renal mass  Postop diagnosis: Same  Procedure: 1.  Left robot assisted laparoscopic radical nephrectomy  Attending: Belvie Clara, MD  Assistant: Oneil Budge, MD  Anesthesia: General  Estimated blood loss: 50 cc  Drains: 16 French Foley catheter  Specimens: Left radical nephrectomy, Gerotas, left adrenal  Antibiotics: ancef   Findings: 2 renal arteries and 1 renal vein  Indications: Patient is a 58 year old with a history of 10 cm left renal mass.  The mass was not amenable to partial nephrectomy.  After discussing treatment options patient decided to proceed with left robot assisted laparoscopic radical nephrectomy.  Procedure in detail: Prior to procedure consent was obtained. Patient was brought to the operating room and briefing was done sure correct patient, correct procedure, correct site.  General anesthesia was in administered patient was placed in the right lateral decubitus position.  a 16 French catheter was placed. their abdomen and flank was then prepped and draped usual sterile fashion.  A Veress needle was used to obtain pneumoperitoneum.  Once pneumoperitoneum was reestablished to 15 mmHg we then placed a 8 mm camera port lateral to the umbilicus at the latera; edge of rectus.  We then proceeded to place 3 more robotic ports. We then placed an assistant port. We then docked the robot.  We then started this dissection by removing a extensive amount of anterior abdominal wall adhesions.  We then dissected along the white line of Toldt.  We then reflected the colon medially.  We then identified the psoas muscle.  Once this was done we traced it down to the iliac vessels and identified the ureter.  Once we identified the gonadal vein and ureter were then traced this to the renal hilum.  The renal vein and renal artery were skeletonized.  We did we identified one renal vein and two renal arteries.  Using the [employer] power stapler within ligated the  renal artery.  Once this was done we then used a second staple load to ligate the renal vein.  We then used a tissue load to ligate the gonadal vein and the ureter.  Once this was done we then freed the kidney from its lateral and posterior attachments. We inspected the retroperitoneum and noted no residula bleeding. We then removed our instruments, undocked the robot, and released the pneumoperitoneum.  We then made a midline incision above the umbilicus to remove the specimen.  Once the specimen was removed we then closed the camera and assistant ports with 0 Vicryl in interrupted fashion.  We then closed the midline incision with looped PDS in a running fashion.  We then closed the overlying skin with 2-0 Vicryl in running fashion.  These skin was then closed with staples.  We then placed Dermabond over all the incisions.  This included the procedure which resulted by the patient. The assistant was utilized for retraction, suction, passing instruments and closing the incisions  Complications: None  Condition: Stable, x-rayed, transferred to PACU.  Plan: Patient is to be admitted for inpatient stay. The foley catheter will be removed in the morning. They will be started on a clear liquid diet POD#1

## 2024-04-04 NOTE — Interval H&P Note (Signed)
 History and Physical Interval Note:  04/04/2024 10:05 AM  Jane Baker  has presented today for surgery, with the diagnosis of Kidney Mass.  The various methods of treatment have been discussed with the patient and family. After consideration of risks, benefits and other options for treatment, the patient has consented to  Procedure(s): NEPHRECTOMY, RADICAL, ROBOT-ASSISTED, LAPAROSCOPIC, ADULT (Left) as a surgical intervention.  The patient's history has been reviewed, patient examined, no change in status, stable for surgery.  I have reviewed the patient's chart and labs.  Questions were answered to the patient's satisfaction.     Belvie Clara

## 2024-04-05 ENCOUNTER — Encounter (HOSPITAL_COMMUNITY): Payer: Self-pay | Admitting: Urology

## 2024-04-05 LAB — CBC
HCT: 36.7 % (ref 36.0–46.0)
Hemoglobin: 12.1 g/dL (ref 12.0–15.0)
MCH: 29.8 pg (ref 26.0–34.0)
MCHC: 33 g/dL (ref 30.0–36.0)
MCV: 90.4 fL (ref 80.0–100.0)
Platelets: 250 K/uL (ref 150–400)
RBC: 4.06 MIL/uL (ref 3.87–5.11)
RDW: 12.1 % (ref 11.5–15.5)
WBC: 11.5 K/uL — ABNORMAL HIGH (ref 4.0–10.5)
nRBC: 0 % (ref 0.0–0.2)

## 2024-04-05 NOTE — Plan of Care (Signed)
  Problem: Education: Goal: Knowledge of General Education information will improve Description: Including pain rating scale, medication(s)/side effects and non-pharmacologic comfort measures Outcome: Progressing   Problem: Clinical Measurements: Goal: Ability to maintain clinical measurements within normal limits will improve Outcome: Progressing Goal: Will remain free from infection Outcome: Progressing Goal: Diagnostic test results will improve Outcome: Progressing Goal: Cardiovascular complication will be avoided Outcome: Progressing   Problem: Nutrition: Goal: Adequate nutrition will be maintained Outcome: Progressing   Problem: Coping: Goal: Level of anxiety will decrease Outcome: Progressing   Problem: Elimination: Goal: Will not experience complications related to bowel motility Outcome: Progressing Goal: Will not experience complications related to urinary retention Outcome: Progressing   Problem: Pain Managment: Goal: General experience of comfort will improve and/or be controlled Outcome: Progressing   Problem: Safety: Goal: Ability to remain free from injury will improve Outcome: Progressing   Problem: Skin Integrity: Goal: Risk for impaired skin integrity will decrease Outcome: Progressing

## 2024-04-05 NOTE — Progress Notes (Signed)
 1 Day Post-Op Subjective: Patient reports mild incisional pain. Patient ambulating without issue. Negative flatus. Tolerating clear liquids  Objective: Vital signs in last 24 hours: Temp:  [97.7 F (36.5 C)-98.7 F (37.1 C)] 98.7 F (37.1 C) (10/21 0409) Pulse Rate:  [67-85] 74 (10/21 0409) Resp:  [12-18] 18 (10/21 0409) BP: (105-181)/(62-120) 109/67 (10/21 0409) SpO2:  [95 %-100 %] 95 % (10/21 0409)  Intake/Output from previous day: 10/20 0701 - 10/21 0700 In: 1465.3 [I.V.:1265.3; IV Piggyback:200] Out: 1350 [Urine:1300; Blood:50] Intake/Output this shift: Total I/O In: 240 [P.O.:240] Out: -   Physical Exam:  General:alert, cooperative, and appears stated age GI: soft, non tender, normal bowel sounds, no palpable masses, no organomegaly, no inguinal hernia Female genitalia: not done Extremities: extremities normal, atraumatic, no cyanosis or edema  Lab Results: Recent Labs    04/04/24 1444 04/05/24 0428  HGB 13.4 12.1  HCT 41.5 36.7   BMET Recent Labs    04/04/24 1444  NA 137  K 4.6  CL 101  CO2 26  GLUCOSE 135*  BUN 10  CREATININE 1.03*  CALCIUM  9.3   No results for input(s): LABPT, INR in the last 72 hours. No results for input(s): LABURIN in the last 72 hours. No results found for this or any previous visit.  Studies/Results: No results found.  Assessment/Plan: POD#1 left radical nephrectomy Continue current pain control regiment D/c foley Ambulate in hall with assistance Advance diet as tolerated   LOS: 1 day   Jane Baker 04/05/2024, 11:47 AM

## 2024-04-05 NOTE — TOC CM/SW Note (Signed)
  Transition of Care [facility]) - Inpatient Brief Assessment   Patient Details  Name: Jane Baker MRN: [id number] Date of Birth: 01-06-1966  Transition of [facility]) CM/SW Contact:    Noreen KATHEE Cleotilde ISRAEL Phone Number: 04/05/2024, 9:22 AM   Clinical Narrative:  Inpatient Care Management (ICM) has reviewed patient and no other ICM needs have been identified at this time. We will continue to monitor patient advancement through interdisciplinary progression rounds. If new patient transition needs arise, please place a ICM consult.  Transition of Care Asessment: Insurance and Status: Insurance coverage has been reviewed Patient has primary care physician: Yes Home environment has been reviewed: Single Family Home Prior level of function:: Independent Prior/Current Home Services: No current home services Social Drivers of Health Review: SDOH reviewed no interventions necessary Readmission risk has been reviewed: Yes Transition of care needs: no transition of care needs at this time

## 2024-04-05 NOTE — Plan of Care (Signed)
  Problem: Education: Goal: Knowledge of General Education information will improve Description: Including pain rating scale, medication(s)/side effects and non-pharmacologic comfort measures Outcome: Progressing   Problem: Education: Goal: Knowledge of General Education information will improve Description: Including pain rating scale, medication(s)/side effects and non-pharmacologic comfort measures Outcome: Progressing   Problem: Health Behavior/Discharge Planning: Goal: Ability to manage health-related needs will improve Outcome: Not Applicable   Problem: Clinical Measurements: Goal: Ability to maintain clinical measurements within normal limits will improve Outcome: Progressing Goal: Will remain free from infection Outcome: Progressing Goal: Diagnostic test results will improve Outcome: Progressing Goal: Respiratory complications will improve Outcome: Not Applicable   Problem: Activity: Goal: Risk for activity intolerance will decrease Outcome: Not Applicable   Problem: Nutrition: Goal: Adequate nutrition will be maintained Outcome: Progressing   Problem: Activity: Goal: Risk for activity intolerance will decrease Outcome: Not Applicable   Problem: Activity: Goal: Risk for activity intolerance will decrease Outcome: Not Applicable   Problem: Elimination: Goal: Will not experience complications related to bowel motility Outcome: Progressing Goal: Will not experience complications related to urinary retention Outcome: Progressing   Problem: Pain Managment: Goal: General experience of comfort will improve and/or be controlled Outcome: Progressing   Problem: Safety: Goal: Ability to remain free from injury will improve Outcome: Progressing   Problem: Skin Integrity: Goal: Risk for impaired skin integrity will decrease Outcome: Progressing

## 2024-04-06 LAB — SURGICAL PATHOLOGY

## 2024-04-06 MED ORDER — OXYCODONE-ACETAMINOPHEN 5-325 MG PO TABS: 1.0000 | ORAL_TABLET | ORAL | 0 refills | Status: DC | PRN

## 2024-04-07 NOTE — Anesthesia Postprocedure Evaluation (Signed)
 Anesthesia Post Note  Patient: Oni Dietzman Kitchens  Procedure(s) Performed: NEPHRECTOMY AND ADRENALECTOMY , RADICAL, ROBOT-ASSISTED, LAPAROSCOPIC, ADULT (Left: Abdomen)  Patient location during evaluation: Phase II Anesthesia Type: General Level of consciousness: awake Pain management: pain level controlled Vital Signs Assessment: post-procedure vital signs reviewed and stable Respiratory status: spontaneous breathing and respiratory function stable Cardiovascular status: blood pressure returned to baseline and stable Postop Assessment: no headache and no apparent nausea or vomiting Anesthetic complications: no Comments: Late entry   No notable events documented.   Last Vitals:  Vitals:   04/05/24 2043 04/06/24 0437  BP: 106/66 115/85  Pulse: 60 62  Resp: 18 18  Temp: 36.8 C 36.8 C  SpO2: 96% 100%    Last Pain:  Vitals:   04/06/24 1319  TempSrc:   PainSc: 8                  Yvonna JINNY Bosworth

## 2024-04-12 NOTE — Discharge Summary (Signed)
 Physician Discharge Summary  Patient ID: Jane Baker MRN: [id number] DOB/AGE: Oct 17, 1965 58 y.o.  Admit date: 04/04/2024 Discharge date: 04/06/2024  Admission Diagnoses:  Left renal mass  Discharge Diagnoses:  Principal Problem:   Left renal mass Active Problems:   Left kidney mass   Past Medical History:  Diagnosis Date   Asthma    GERD (gastroesophageal reflux disease)    Hx estrogen therapy 12/30/2012   Hypercholesteremia     Surgeries: Procedure(s): NEPHRECTOMY AND ADRENALECTOMY , RADICAL, ROBOT-ASSISTED, LAPAROSCOPIC, ADULT on 04/04/2024   Consultants (if any):   Discharged Condition: Improved  Hospital Course: Jane Baker is an 58 y.o. female who was admitted 04/04/2024 with a diagnosis of Left renal mass and went to the operating room on 04/04/2024 and underwent the above named procedures.    She was given perioperative antibiotics:  Anti-infectives (From admission, onward)    Start     Dose/Rate Route Frequency Ordered Stop   04/04/24 2000  ceFAZolin  (ANCEF ) IVPB 2g/100 mL premix        2 g 200 mL/hr over 30 Minutes Intravenous Every 8 hours 04/04/24 1500 04/05/24 0333   04/04/24 0906  ceFAZolin  (ANCEF ) 2-4 GM/100ML-% IVPB       Note to Pharmacy: Hollie, Christina R: cabinet override      04/04/24 0906 04/04/24 1226   04/04/24 0904  ceFAZolin  (ANCEF ) IVPB 2g/100 mL premix        2 g 200 mL/hr over 30 Minutes Intravenous 30 min pre-op 04/04/24 0904 04/04/24 1235     .  She was given sequential compression devices, early ambulation for DVT prophylaxis.  She benefited maximally from the hospital stay and there were no complications.    Inpatient Morphine Milligram Equivalents Per Day 10/20 - 10/22   Values displayed are in units of MME/Day    Order Start / End Date 10/20 10/21 10/22     oxyCODONE  (Oxy IR/ROXICODONE ) immediate release tablet 5 mg 10/20 - 10/20 0 of Unknown -- --    oxyCODONE  (ROXICODONE ) 5 MG/5ML solution 5 mg 10/20 -  10/20 0 of Unknown -- --      Group total: 0 of Unknown      fentaNYL  (SUBLIMAZE ) injection 25-50 mcg 10/20 - 10/20 30 of 45-90 -- --    fentaNYL  citrate (PF) (SUBLIMAZE ) injection 10/20 - 10/20 *75 of 75 -- --    oxyCODONE -acetaminophen  (PERCOCET/ROXICET) 5-325 MG per tablet 1-2 tablet 10/20 - 10/22 15 of 22.5-45 60 of 45-90 22.5 of 30-60    HYDROmorphone  (DILAUDID ) injection 0.5-1 mg 10/20 - 10/22 40 of 50-100 0 of 120-240 0 of 90-180    HYDROmorphone  (DILAUDID ) injection 10/20 - 10/20 *10 of 10 -- --    Daily Totals  * 170 of Unknown (at least 202.5-320) 60 of 165-330 22.5 of 120-240  *One-Step medication  Calculation Errors     Order Type Date Details   oxyCODONE  (Oxy IR/ROXICODONE ) immediate release tablet 5 mg Ordered Dose -- Insufficient frequency information   oxyCODONE  (ROXICODONE ) 5 MG/5ML solution 5 mg Ordered Dose -- Insufficient frequency information            Recent vital signs:  Vitals:   04/05/24 2043 04/06/24 0437  BP: 106/66 115/85  Pulse: 60 62  Resp: 18 18  Temp: 98.2 F (36.8 C) 98.3 F (36.8 C)  SpO2: 96% 100%    Recent laboratory studies:  Lab Results  Component Value Date   HGB 12.1 04/05/2024   HGB 13.4 04/04/2024  HGB 13.9 03/31/2024   Lab Results  Component Value Date   WBC 11.5 (H) 04/05/2024   PLT 250 04/05/2024   No results found for: INR Lab Results  Component Value Date   NA 137 04/04/2024   K 4.6 04/04/2024   CL 101 04/04/2024   CO2 26 04/04/2024   BUN 10 04/04/2024   CREATININE 1.03 (H) 04/04/2024   GLUCOSE 135 (H) 04/04/2024    Discharge Medications:   Allergies as of 04/06/2024       Reactions   Bee Venom Anaphylaxis, Hives, Swelling, Other (See Comments)   Wasp, yellow jackets, hornets        Medication List     TAKE these medications    ADULT ONE DAILY GUMMIES PO Take 2 each by mouth daily. [employer]uth's Women's Multivitamin Gummies   albuterol  108 (90 Base) MCG/ACT inhaler Commonly known as: VENTOLIN   HFA Inhale 2 puffs into the lungs every 6 (six) hours as needed for wheezing.   atorvastatin  20 MG tablet Commonly known as: LIPITOR TAKE 1 TABLET(20 MG) BY MOUTH DAILY What changed: See the new instructions.   EPINEPHrine  0.3 mg/0.3 mL Soaj injection Commonly known as: EPI-PEN Inject 0.3 mg into the muscle as needed for anaphylaxis.   oxyCODONE -acetaminophen  5-325 MG tablet Commonly known as: PERCOCET/ROXICET Take 1-2 tablets by mouth every 4 (four) hours as needed for moderate pain (pain score 4-6).        Diagnostic Studies: Chest 2 View Result Date: 03/20/2024 CLINICAL DATA:  left renal mass EXAM: CHEST - 2 VIEW COMPARISON:  11/14/2017 FINDINGS: The heart size and mediastinal contours are within normal limits. Both lungs are clear. The visualized skeletal structures are unremarkable. IMPRESSION: No active cardiopulmonary disease.  Stable exam. Electronically Signed   By: CHRISTELLA.  Shick M.D.   On: 03/20/2024 19:40    Disposition: Discharge disposition: 01-Home or Self Care          Follow-up Information     Sreekar Broyhill, Belvie CROME, MD. Call in 1 week(s).   Specialty: Urology Contact information: [address]  [place] [address] [phone]                  Signed: Belvie Clara 04/12/2024, 8:28 AM

## 2024-04-20 ENCOUNTER — Ambulatory Visit (INDEPENDENT_AMBULATORY_CARE_PROVIDER_SITE_OTHER): Admitting: Urology

## 2024-04-20 ENCOUNTER — Encounter: Payer: Self-pay | Admitting: Urology

## 2024-04-20 VITALS — BP 128/90 | HR 86

## 2024-04-20 DIAGNOSIS — C642 Malignant neoplasm of left kidney, except renal pelvis: Secondary | ICD-10-CM

## 2024-04-20 NOTE — Progress Notes (Signed)
 04/20/2024 1:36 PM   Jane Baker Jane Baker 18, 1967 [id number]  Referring provider: Cook, Jayce G, DO [address] [place],  [place] [address]  Followup after nephrectomy   HPI: [name] is a 58yo here for followup after left radical nephrectomy. Pathology chromophobe RCC, T2a, negative margins. She is off pain medication. Mild incisional pain.    PMH: Past Medical History:  Diagnosis Date   Asthma    GERD (gastroesophageal reflux disease)    Hx estrogen therapy 12/30/2012   Hypercholesteremia     Surgical History: Past Surgical History:  Procedure Laterality Date   ABDOMINAL HYSTERECTOMY     BILATERAL SALPINGOOPHORECTOMY     BREAST BIOPSY Left 2018   benign   ESOPHAGOGASTRODUODENOSCOPY N/A 01/11/2018   Procedure: ESOPHAGOGASTRODUODENOSCOPY (EGD);  Surgeon: Golda Claudis PENNER, MD;  Location: [facility];  Service: Endoscopy;  Laterality: N/A;  1300   MASS EXCISION N/A 02/03/2018   Procedure: EXCISION LIPOMA 3 CM ON BACK;  Surgeon: Kallie Manuelita BROCKS, MD;  Location: [facility];  Service: General;  Laterality: N/A;   ROBOT ASSISTED LAPAROSCOPIC NEPHRECTOMY Left 04/04/2024   Procedure: NEPHRECTOMY AND ADRENALECTOMY , RADICAL, ROBOT-ASSISTED, LAPAROSCOPIC, ADULT;  Surgeon: Sherrilee Belvie CROME, MD;  Location: [facility];  Service: Urology;  Laterality: Left;    Home Medications:  Allergies as of 04/20/2024       Reactions   Bee Venom Anaphylaxis, Hives, Swelling, Other (See Comments)   Wasp, yellow jackets, hornets        Medication List        Accurate as of April 20, 2024  1:36 PM. If you have any questions, ask your nurse or doctor.          ADULT ONE DAILY GUMMIES PO Take 2 each by mouth daily. [employer]'s Women's Multivitamin Gummies   albuterol  108 (90 Base) MCG/ACT inhaler Commonly known as: VENTOLIN  HFA Inhale 2 puffs into the lungs every 6 (six) hours as needed for wheezing.   atorvastatin  20 MG tablet Commonly known as: LIPITOR TAKE 1  TABLET(20 MG) BY MOUTH DAILY What changed: See the new instructions.   EPINEPHrine  0.3 mg/0.3 mL Soaj injection Commonly known as: EPI-PEN Inject 0.3 mg into the muscle as needed for anaphylaxis.   oxyCODONE -acetaminophen  5-325 MG tablet Commonly known as: PERCOCET/ROXICET Take 1-2 tablets by mouth every 4 (four) hours as needed for moderate pain (pain score 4-6).        Allergies:  Allergies  Allergen Reactions   Bee Venom Anaphylaxis, Hives, Swelling and Other (See Comments)    Wasp, yellow jackets, hornets    Family History: Family History  Problem Relation Age of Onset   Hyperlipidemia Mother    Hypertension Father    Heart disease Father    Hyperlipidemia Father    Cancer Paternal Grandmother    Heart disease Paternal Grandmother    Cancer Sister 45       died bladder and vaginal cancer    Social History:  reports that she has never smoked. She has never used smokeless tobacco. She reports that she does not drink alcohol and does not use drugs.  ROS: All other review of systems were reviewed and are negative except what is noted above in HPI  Physical Exam: BP (!) 128/90   Pulse 86   Constitutional:  Alert and oriented, No acute distress. HEENT: [place] AT, moist mucus membranes.  Trachea midline, no masses. Cardiovascular: No clubbing, cyanosis, or edema. Respiratory: Normal respiratory effort, no increased work of  breathing. GI: Abdomen is soft, nontender, nondistended, no abdominal masses GU: No CVA tenderness.  Lymph: No cervical or inguinal lymphadenopathy. Skin: No rashes, bruises or suspicious lesions. Neurologic: Grossly intact, no focal deficits, moving all 4 extremities. Psychiatric: Normal mood and affect.  Laboratory Data: Lab Results  Component Value Date   WBC 11.5 (H) 04/05/2024   HGB 12.1 04/05/2024   HCT 36.7 04/05/2024   MCV 90.4 04/05/2024   PLT 250 04/05/2024    Lab Results  Component Value Date   CREATININE 1.03 (H) 04/04/2024     No results found for: PSA  No results found for: TESTOSTERONE  Lab Results  Component Value Date   HGBA1C 5.4 04/26/2021    Urinalysis    Component Value Date/Time   APPEARANCEUR Clear 03/16/2024 0808   GLUCOSEU Negative 03/16/2024 0808   BILIRUBINUR Negative 03/16/2024 0808   PROTEINUR Negative 03/16/2024 0808   NITRITE Negative 03/16/2024 0808   LEUKOCYTESUR Negative 03/16/2024 9191    Lab Results  Component Value Date   LABMICR Comment 03/16/2024    Pertinent Imaging:  No results found for this or any previous visit.  No results found for this or any previous visit.  No results found for this or any previous visit.  No results found for this or any previous visit.  No results found for this or any previous visit.  No results found for this or any previous visit.  No results found for this or any previous visit.  No results found for this or any previous visit.   Assessment & Plan:    1. 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  Belvie Clara, MD  [facility] [place]

## 2024-04-20 NOTE — Progress Notes (Signed)
 Patient here today for staples removal. 24 staples visualized, in total.  6 staples removed today per MD order. Steri strips applied . No complications were noted. incision site  healing well Patient tolerated staple removal with no issues. Patient will keep followup  2 weeks for the removal of 18 staples

## 2024-04-21 LAB — BASIC METABOLIC PANEL WITH GFR
BUN/Creatinine Ratio: 12 (ref 12–28)
BUN: 13 mg/dL (ref 8–27)
CO2: 23 mmol/L (ref 20–29)
Calcium: 10.3 mg/dL (ref 8.7–10.3)
Chloride: 100 mmol/L (ref 96–106)
Creatinine, Ser: 1.12 mg/dL — ABNORMAL HIGH (ref 0.57–1.00)
Glucose: 93 mg/dL (ref 70–99)
Potassium: 5.1 mmol/L (ref 3.5–5.2)
Sodium: 137 mmol/L (ref 134–144)
eGFR: 56 mL/min/1.73 — ABNORMAL LOW (ref >59)

## 2024-04-26 ENCOUNTER — Ambulatory Visit: Payer: Self-pay | Admitting: Urology

## 2024-05-03 ENCOUNTER — Ambulatory Visit

## 2024-05-03 DIAGNOSIS — N2889 Other specified disorders of kidney and ureter: Secondary | ICD-10-CM

## 2024-05-03 DIAGNOSIS — C642 Malignant neoplasm of left kidney, except renal pelvis: Secondary | ICD-10-CM

## 2024-05-03 NOTE — Progress Notes (Signed)
 Patient here today for staples removal. 18 staples visualized, in total.  18 staples removed today per MD order. Steri strips applied . No complications were noted. incision site  healing well Patient tolerated staple removal with no issues. Patient will keep followup  08/19/2024 for

## 2024-05-24 NOTE — Assessment & Plan Note (Signed)
 Baseline CT chest Follow-up abdomen and pelvis CT with and without contrast every 6 months for 2 years, then annually for up to 5 years, longer as clinically indicated CBC, CMP

## 2024-05-24 NOTE — Progress Notes (Unsigned)
 [facility] CONSULT NOTE  Patient Care Team: Cook, Jayce G, DO as PCP - General (Family Medicine)  ASSESSMENT & PLAN:  Kimoni is a 58 y.o.female with history of *** being seen at [facility] Clinic for ***.  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  Patient has known clear-cell histology, chromophobe RCC.  Commend surveillance. Assessment & Plan   No orders of the defined types were placed in this encounter.   I personally spent a total of *** minutes in the care of the patient today including {Time Based Coding:[id number]}.   All questions were answered. The patient knows to call the clinic with any problems, questions or concerns. No barriers to learning was detected.  Pauletta JAYSON Chihuahua, MD 12/9/20258:59 AM  CHIEF COMPLAINTS/PURPOSE OF CONSULTATION:  ***  HISTORY OF PRESENTING ILLNESS:  Jane Baker 58 y.o. female is here because of *** I have reviewed her chart and materials related to her cancer extensively and collaborated history with the patient. Summary of oncologic history is as follows: Oncology History   No history exists.    MEDICAL HISTORY:  Past Medical History:  Diagnosis Date   Asthma    GERD (gastroesophageal reflux disease)    Hx estrogen therapy 12/30/2012   Hypercholesteremia     SURGICAL HISTORY: Past Surgical History:  Procedure Laterality Date   ABDOMINAL HYSTERECTOMY     BILATERAL SALPINGOOPHORECTOMY     BREAST BIOPSY Left 2018   benign   ESOPHAGOGASTRODUODENOSCOPY N/A 01/11/2018   Procedure: ESOPHAGOGASTRODUODENOSCOPY (EGD);  Surgeon: Golda Claudis PENNER, MD;  Location: [facility];  Service: Endoscopy;  Laterality: N/A;  1300   MASS EXCISION N/A 02/03/2018   Procedure: EXCISION LIPOMA 3 CM ON BACK;  Surgeon: Kallie Manuelita JAYSON, MD;  Location: [facility] ORS;  Service: General;  Laterality: N/A;   ROBOT ASSISTED LAPAROSCOPIC NEPHRECTOMY Left 04/04/2024   Procedure: NEPHRECTOMY AND ADRENALECTOMY ,  RADICAL, ROBOT-ASSISTED, LAPAROSCOPIC, ADULT;  Surgeon: Sherrilee Belvie CROME, MD;  Location: [facility] ORS;  Service: Urology;  Laterality: Left;    SOCIAL HISTORY: Social History   Socioeconomic History   Marital status: Married    Spouse name: Not on file   Number of children: Not on file   Years of education: Not on file   Highest education level: Some college, no degree  Occupational History   Not on file  Tobacco Use   Smoking status: Never   Smokeless tobacco: Never  Vaping Use   Vaping status: Never Used  Substance and Sexual Activity   Alcohol use: No   Drug use: No   Sexual activity: Yes    Birth control/protection: Surgical  Other Topics Concern   Not on file  Social History Narrative   Not on file   Social Drivers of Health   Financial Resource Strain: Low Risk  (03/07/2024)   Overall Financial Resource Strain (CARDIA)    Difficulty of Paying Living Expenses: Not very hard  Food Insecurity: No Food Insecurity (05/22/2024)   Hunger Vital Sign    Worried About Running Out of Food in the Last Year: Never true    Ran Out of Food in the Last Year: Never true  Transportation Needs: No Transportation Needs (05/22/2024)   PRAPARE - Administrator, Civil Service (Medical): No    Lack of Transportation (Non-Medical): No  Physical Activity: Insufficiently Active (03/07/2024)   Exercise Vital Sign    Days of Exercise per Week: 3 days  Minutes of Exercise per Session: 20 min  Stress: No Stress Concern Present (03/07/2024)   [employer] of Occupational Health - Occupational Stress Questionnaire    Feeling of Stress: Not at all  Social Connections: Socially Integrated (03/07/2024)   Social Connection and Isolation Panel    Frequency of Communication with Friends and Family: More than three times a week    Frequency of Social Gatherings with Friends and Family: More than three times a week    Attends Religious Services: More than 4 times per year    Active Member  of [employer] or Organizations: Yes    Attends Engineer, Structural: More than 4 times per year    Marital Status: Married  Catering Manager Violence: Not At Risk (04/04/2024)   Humiliation, Afraid, Rape, and Kick questionnaire    Fear of Current or Ex-Partner: No    Emotionally Abused: No    Physically Abused: No    Sexually Abused: No    FAMILY HISTORY: Family History  Problem Relation Age of Onset   Hyperlipidemia Mother    Hypertension Father    Heart disease Father    Hyperlipidemia Father    Cancer Paternal Grandmother    Heart disease Paternal Grandmother    Cancer Sister 55       died bladder and vaginal cancer    ALLERGIES:  is allergic to bee venom.  MEDICATIONS:  Current Outpatient Medications  Medication Sig Dispense Refill   albuterol  (VENTOLIN  HFA) 108 (90 Base) MCG/ACT inhaler Inhale 2 puffs into the lungs every 6 (six) hours as needed for wheezing. 18 g 2   atorvastatin  (LIPITOR) 20 MG tablet TAKE 1 TABLET(20 MG) BY MOUTH DAILY (Patient taking differently: Take 20 mg by mouth at bedtime.) 90 tablet 2   EPINEPHrine  0.3 mg/0.3 mL IJ SOAJ injection Inject 0.3 mg into the muscle as needed for anaphylaxis. 1 each 0   Multiple Vitamins-Minerals (ADULT ONE DAILY GUMMIES PO) Take 2 each by mouth daily. [employer] Women's Multivitamin Gummies     oxyCODONE -acetaminophen  (PERCOCET/ROXICET) 5-325 MG tablet Take 1-2 tablets by mouth every 4 (four) hours as needed for moderate pain (pain score 4-6). 30 tablet 0   No current facility-administered medications for this visit.    REVIEW OF SYSTEMS:   All relevant systems were reviewed with the patient and are negative.  PHYSICAL EXAMINATION: ECOG PERFORMANCE STATUS: {CHL ONC ECOG [phone]}  There were no vitals filed for this visit. There were no vitals filed for this visit.  GENERAL: alert, no distress and comfortable SKIN: skin color is normal, no jaundice, rashes or significant lesions EYES: sclera  clear OROPHARYNX: no exudate, no erythema NECK: supple LYMPH:  no palpable lymphadenopathy in the cervical, axillary regions LUNGS: Effort normal, no respiratory distress.  Clear to auscultation bilaterally HEART: regular rate & rhythm and no lower extremity edema ABDOMEN: soft, non-tender and nondistended Musculoskeletal: no edema NEURO: no focal motor/sensory deficits  LABORATORY DATA:  I have reviewed the data as listed Lab Results  Component Value Date   WBC 11.5 (H) 04/05/2024   HGB 12.1 04/05/2024   HCT 36.7 04/05/2024   MCV 90.4 04/05/2024   PLT 250 04/05/2024   Recent Labs    03/07/24 1606 04/04/24 1444 04/20/24 1410  NA 137 137 137  K 4.0 4.6 5.1  CL 100 101 100  CO2 21 26 23   GLUCOSE 94 135* 93  BUN 10 10 13   CREATININE 1.03* 1.03* 1.12*  CALCIUM  10.3 9.3 10.3  GFRNONAA  --  >  60  --   PROT 7.7  --   --   ALBUMIN 4.9  --   --   AST 27  --   --   ALT 18  --   --   ALKPHOS 93  --   --   BILITOT 0.8  --   --     RADIOGRAPHIC STUDIES: I have personally reviewed the radiological images as listed and agreed with the findings in the report. No results found.

## 2024-05-26 ENCOUNTER — Ambulatory Visit: Payer: Self-pay

## 2024-05-26 ENCOUNTER — Inpatient Hospital Stay

## 2024-05-26 VITALS — BP 130/76 | HR 67 | Temp 97.4°F | Ht 62.0 in | Wt 146.4 lb

## 2024-05-26 DIAGNOSIS — C642 Malignant neoplasm of left kidney, except renal pelvis: Secondary | ICD-10-CM | POA: Diagnosis not present

## 2024-05-26 DIAGNOSIS — Z905 Acquired absence of kidney: Secondary | ICD-10-CM | POA: Diagnosis not present

## 2024-05-26 DIAGNOSIS — Z79899 Other long term (current) drug therapy: Secondary | ICD-10-CM | POA: Diagnosis not present

## 2024-05-26 DIAGNOSIS — Z8049 Family history of malignant neoplasm of other genital organs: Secondary | ICD-10-CM | POA: Diagnosis not present

## 2024-05-26 DIAGNOSIS — R7989 Other specified abnormal findings of blood chemistry: Secondary | ICD-10-CM

## 2024-05-26 LAB — CBC WITH DIFFERENTIAL (CANCER CENTER ONLY)
Abs Immature Granulocytes: 0.02 K/uL (ref 0.00–0.07)
Basophils Absolute: 0 K/uL (ref 0.0–0.1)
Basophils Relative: 0 %
Eosinophils Absolute: 0.1 K/uL (ref 0.0–0.5)
Eosinophils Relative: 1 %
HCT: 42.7 % (ref 36.0–46.0)
Hemoglobin: 14.4 g/dL (ref 12.0–15.0)
Immature Granulocytes: 0 %
Lymphocytes Relative: 34 %
Lymphs Abs: 2.5 K/uL (ref 0.7–4.0)
MCH: 29.6 pg (ref 26.0–34.0)
MCHC: 33.7 g/dL (ref 30.0–36.0)
MCV: 87.9 fL (ref 80.0–100.0)
Monocytes Absolute: 0.5 K/uL (ref 0.1–1.0)
Monocytes Relative: 6 %
Neutro Abs: 4.3 K/uL (ref 1.7–7.7)
Neutrophils Relative %: 59 %
Platelet Count: 315 K/uL (ref 150–400)
RBC: 4.86 MIL/uL (ref 3.87–5.11)
RDW: 12.7 % (ref 11.5–15.5)
WBC Count: 7.3 K/uL (ref 4.0–10.5)
nRBC: 0 % (ref 0.0–0.2)

## 2024-05-26 LAB — COMPREHENSIVE METABOLIC PANEL WITH GFR
ALT: 67 U/L — ABNORMAL HIGH (ref 0–44)
AST: 52 U/L — ABNORMAL HIGH (ref 15–41)
Albumin: 4.8 g/dL (ref 3.5–5.0)
Alkaline Phosphatase: 126 U/L (ref 38–126)
Anion gap: 10 (ref 5–15)
BUN: 15 mg/dL (ref 6–20)
CO2: 27 mmol/L (ref 22–32)
Calcium: 10.7 mg/dL — ABNORMAL HIGH (ref 8.9–10.3)
Chloride: 101 mmol/L (ref 98–111)
Creatinine, Ser: 1.15 mg/dL — ABNORMAL HIGH (ref 0.44–1.00)
GFR, Estimated: 54 mL/min — ABNORMAL LOW (ref >60)
Glucose, Bld: 96 mg/dL (ref 70–99)
Potassium: 5.3 mmol/L — ABNORMAL HIGH (ref 3.5–5.1)
Sodium: 139 mmol/L (ref 135–145)
Total Bilirubin: 0.8 mg/dL (ref 0.0–1.2)
Total Protein: 8.3 g/dL — ABNORMAL HIGH (ref 6.5–8.1)

## 2024-06-03 ENCOUNTER — Ambulatory Visit (HOSPITAL_COMMUNITY)
Admission: RE | Admit: 2024-06-03 | Discharge: 2024-06-03 | Disposition: A | Discharge status: Home / Self Care | Source: Ambulatory Visit

## 2024-06-03 DIAGNOSIS — R7989 Other specified abnormal findings of blood chemistry: Secondary | ICD-10-CM | POA: Insufficient documentation

## 2024-06-03 DIAGNOSIS — C642 Malignant neoplasm of left kidney, except renal pelvis: Secondary | ICD-10-CM | POA: Diagnosis present

## 2024-06-03 MED ORDER — IOHEXOL 300 MG/ML  SOLN
100.0000 mL | Freq: Once | INTRAMUSCULAR | Status: AC | PRN
  Administered 2024-06-03: 100 mL via INTRAVENOUS

## 2024-06-10 ENCOUNTER — Encounter (HOSPITAL_COMMUNITY): Payer: Self-pay

## 2024-06-10 ENCOUNTER — Ambulatory Visit (HOSPITAL_COMMUNITY)

## 2024-06-17 ENCOUNTER — Inpatient Hospital Stay: Payer: Self-pay

## 2024-06-17 DIAGNOSIS — C642 Malignant neoplasm of left kidney, except renal pelvis: Secondary | ICD-10-CM

## 2024-06-17 DIAGNOSIS — R7989 Other specified abnormal findings of blood chemistry: Secondary | ICD-10-CM

## 2024-06-17 NOTE — Progress Notes (Signed)
 [facility] OFFICE PROGRESS NOTE  Patient Care Team: Cook, Jayce G, DO as PCP - General (Family Medicine)  Telephone visit requested per patient. Physician location: [facility] Patient location: Home Total time for the encounter:   Jane Baker is a 59 Baker with history of hyperlipidemia, asthma being seen at [facility] Clinic for [facility].   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  Margins negative.  No sarcomatoid features.  No findings of multifocal disease, or bilateral RCC, or family history or early diagnosis.   CT staging postoperatively showed nonspecific opacity in the lung but grossly unchanged from previously reported.  No signs of metastases.  Discussed results in detail with the patient.  She will continue to follow-up locally with Dr. Sherrilee urologist for future surveillance.  Mildly abnormal LFT.  Mild steatosis reported.  No metastases reported.  She is otherwise feeling well.  Husband raise the question about statin.  Probably less likely but recommend make a follow-up with PCP.  Continue follow-up. Assessment & Plan Renal cell carcinoma, left (HCC) Follow-up abdomen and pelvis CT with and without contrast and CT chest every 6 months for 2 years, then annually for up to 5 years, longer as clinically indicated CBC, CMP every 6 months Genetic counselor follow-up.  Report multiple family history of cancers Elevated LFTs Mildly elevated.  Mild steatosis reported CT without metastases. Discussed healthy diet and lifestyle. Continue follow-up with PCP.  Patient will follow-up with us  as needed. Jane JAYSON Chihuahua, MD  INTERVAL HISTORY: Patient has a telephone visit to go over results.  Husband is also present.  She is feeling well.  No abdominal pain, or new symptoms.  Oncology History  Renal cell carcinoma, left (HCC)  03/08/2024 Imaging   CT abdomen pelvis 8.5 cm left renal mass, consistent with  renal cell carcinoma. No evidence of metastatic disease. Colonic diverticulosis, without radiographic evidence of diverticulitis.   04/04/2024 Initial Diagnosis   Renal cell carcinoma, left (HCC)   04/04/2024 Pathology Results   A. LEFT, RENAL AND ADRENAL, NEPHRECTOMY:  Chromophobe renal cell carcinoma, size 9.0 cm  Tumor is limited to the kidney (pT2a)  Lymphovascular invasion is identified  Ureteral, vascular and all margins of resection are negative for  carcinoma  Negative for sarcomatoid or rhabdoid feature.  Margins negative.  Benign adrenal gland    05/24/2024 Cancer Staging   Staging form: Kidney, AJCC 8th Edition - Clinical: Stage II (cT2a, cN0, cM0) - Signed by Baker Jane JAYSON, MD on 05/24/2024 Histologic grade (G): GX Histologic grading system: 4 grade system      Relevant data reviewed during this visit included labs and imaging.

## 2024-06-17 NOTE — Assessment & Plan Note (Addendum)
 Mildly elevated.  Mild steatosis reported CT without metastases. Discussed healthy diet and lifestyle. Continue follow-up with PCP.

## 2024-06-17 NOTE — Assessment & Plan Note (Addendum)
 Follow-up abdomen and pelvis CT with and without contrast and CT chest every 6 months for 2 years, then annually for up to 5 years, longer as clinically indicated CBC, CMP every 6 months Genetic counselor follow-up.  Report multiple family history of cancers

## 2024-06-21 ENCOUNTER — Encounter: Payer: Self-pay | Admitting: Nurse Practitioner

## 2024-06-21 NOTE — Progress Notes (Signed)
 Mychart message sent to patient.

## 2024-07-04 ENCOUNTER — Other Ambulatory Visit: Payer: Self-pay | Admitting: Family Medicine

## 2024-07-15 ENCOUNTER — Inpatient Hospital Stay: Payer: Self-pay

## 2024-08-10 ENCOUNTER — Other Ambulatory Visit

## 2024-08-19 ENCOUNTER — Ambulatory Visit: Admitting: Urology

## 2024-08-26 ENCOUNTER — Inpatient Hospital Stay: Payer: Self-pay
